# Patient Record
Sex: Male | Born: 1940 | Race: White | Hispanic: No | Marital: Married | Smoking: Former smoker
Health system: Southern US, Community
[De-identification: ages and names within clinical notes are randomized; demographics above are authoritative.]

## PROBLEM LIST (undated history)

## (undated) ENCOUNTER — Ambulatory Visit

## (undated) ENCOUNTER — Encounter: Attending: Geriatric Medicine | Primary: Geriatric Medicine

## (undated) ENCOUNTER — Encounter: Attending: Hematology & Oncology | Primary: Hematology & Oncology

## (undated) ENCOUNTER — Encounter

## (undated) ENCOUNTER — Ambulatory Visit: Payer: MEDICARE

## (undated) ENCOUNTER — Ambulatory Visit: Payer: MEDICARE | Attending: Hematology & Oncology | Primary: Hematology & Oncology

## (undated) ENCOUNTER — Institutional Professional Consult (permissible substitution): Payer: MEDICARE | Attending: Internal Medicine | Primary: Internal Medicine

## (undated) ENCOUNTER — Encounter: Attending: Internal Medicine | Primary: Internal Medicine

## (undated) ENCOUNTER — Telehealth: Attending: Radiation Oncology | Primary: Radiation Oncology

## (undated) ENCOUNTER — Telehealth

## (undated) ENCOUNTER — Ambulatory Visit: Attending: Radiation Oncology | Primary: Radiation Oncology

## (undated) ENCOUNTER — Telehealth: Attending: Internal Medicine | Primary: Internal Medicine

## (undated) ENCOUNTER — Telehealth: Attending: Geriatric Medicine | Primary: Geriatric Medicine

## (undated) ENCOUNTER — Ambulatory Visit: Payer: MEDICARE | Attending: Geriatric Medicine | Primary: Geriatric Medicine

## (undated) ENCOUNTER — Encounter
Attending: Student in an Organized Health Care Education/Training Program | Primary: Student in an Organized Health Care Education/Training Program

## (undated) ENCOUNTER — Telehealth: Attending: Hematology & Oncology | Primary: Hematology & Oncology

## (undated) ENCOUNTER — Encounter: Attending: Pharmacotherapy | Primary: Pharmacotherapy

## (undated) ENCOUNTER — Institutional Professional Consult (permissible substitution): Attending: Radiation Oncology | Primary: Radiation Oncology

## (undated) ENCOUNTER — Ambulatory Visit: Payer: MEDICARE | Attending: Internal Medicine | Primary: Internal Medicine

## (undated) ENCOUNTER — Telehealth
Attending: Pharmacist Clinician (PhC)/ Clinical Pharmacy Specialist | Primary: Pharmacist Clinician (PhC)/ Clinical Pharmacy Specialist

## (undated) ENCOUNTER — Encounter
Attending: Pharmacist Clinician (PhC)/ Clinical Pharmacy Specialist | Primary: Pharmacist Clinician (PhC)/ Clinical Pharmacy Specialist

## (undated) ENCOUNTER — Encounter: Attending: Radiation Oncology | Primary: Radiation Oncology

## (undated) ENCOUNTER — Telehealth: Attending: Children | Primary: Children

## (undated) DIAGNOSIS — L723 Sebaceous cyst: Secondary | ICD-10-CM

## (undated) DIAGNOSIS — L02239 Carbuncle of trunk, unspecified: Secondary | ICD-10-CM

## (undated) DIAGNOSIS — M419 Scoliosis, unspecified: Secondary | ICD-10-CM

## (undated) DIAGNOSIS — J449 Chronic obstructive pulmonary disease, unspecified: Secondary | ICD-10-CM

## (undated) DIAGNOSIS — M549 Dorsalgia, unspecified: Secondary | ICD-10-CM

## (undated) DIAGNOSIS — K219 Gastro-esophageal reflux disease without esophagitis: Secondary | ICD-10-CM

## (undated) DIAGNOSIS — S2231XA Fracture of one rib, right side, initial encounter for closed fracture: Secondary | ICD-10-CM

## (undated) DIAGNOSIS — C61 Malignant neoplasm of prostate: Secondary | ICD-10-CM

## (undated) DIAGNOSIS — L02229 Furuncle of trunk, unspecified: Secondary | ICD-10-CM

## (undated) HISTORY — DX: Scoliosis, unspecified: M41.9

## (undated) HISTORY — PX: EXCISIONAL HEMORRHOIDECTOMY: SHX1541

## (undated) HISTORY — DX: Furuncle of trunk, unspecified: L02.229

## (undated) HISTORY — DX: Chronic obstructive pulmonary disease, unspecified: J44.9

## (undated) HISTORY — PX: TONSILLECTOMY: SHX5217

## (undated) HISTORY — DX: Sebaceous cyst: L72.3

## (undated) HISTORY — DX: Dorsalgia, unspecified: M54.9

## (undated) HISTORY — DX: Carbuncle of trunk, unspecified: L02.239

## (undated) HISTORY — DX: Gastro-esophageal reflux disease without esophagitis: K21.9

## (undated) HISTORY — DX: Fracture of one rib, right side, initial encounter for closed fracture: S22.31XA

---
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
## 1997-07-10 ENCOUNTER — Emergency Department (HOSPITAL_COMMUNITY): Admission: RE | Admit: 1997-07-10 | Discharge: 1997-07-10 | Payer: Self-pay | Admitting: Orthopedic Surgery

## 2000-03-01 ENCOUNTER — Emergency Department (HOSPITAL_COMMUNITY): Admission: EM | Admit: 2000-03-01 | Discharge: 2000-03-01 | Payer: Self-pay | Admitting: Emergency Medicine

## 2000-03-01 ENCOUNTER — Encounter: Payer: Self-pay | Admitting: Emergency Medicine

## 2001-01-06 ENCOUNTER — Ambulatory Visit (HOSPITAL_COMMUNITY): Admission: RE | Admit: 2001-01-06 | Discharge: 2001-01-06 | Payer: Self-pay | Admitting: Internal Medicine

## 2001-01-06 ENCOUNTER — Encounter: Payer: Self-pay | Admitting: Internal Medicine

## 2004-09-10 ENCOUNTER — Ambulatory Visit (HOSPITAL_COMMUNITY): Admission: RE | Admit: 2004-09-10 | Discharge: 2004-09-10 | Payer: Self-pay | Admitting: Internal Medicine

## 2004-09-10 ENCOUNTER — Ambulatory Visit: Payer: Self-pay | Admitting: Internal Medicine

## 2005-08-07 ENCOUNTER — Ambulatory Visit: Payer: Self-pay | Admitting: Internal Medicine

## 2005-09-04 ENCOUNTER — Ambulatory Visit: Payer: Self-pay | Admitting: Internal Medicine

## 2005-09-07 ENCOUNTER — Emergency Department (HOSPITAL_COMMUNITY): Admission: EM | Admit: 2005-09-07 | Discharge: 2005-09-07 | Payer: Self-pay | Admitting: Family Medicine

## 2005-09-15 ENCOUNTER — Ambulatory Visit: Payer: Self-pay | Admitting: Internal Medicine

## 2005-09-22 ENCOUNTER — Ambulatory Visit: Payer: Self-pay | Admitting: Internal Medicine

## 2005-10-09 ENCOUNTER — Ambulatory Visit: Payer: Self-pay | Admitting: Internal Medicine

## 2005-10-27 ENCOUNTER — Ambulatory Visit: Payer: Self-pay | Admitting: Internal Medicine

## 2005-11-11 ENCOUNTER — Ambulatory Visit: Payer: Self-pay | Admitting: Emergency Medicine

## 2006-08-18 ENCOUNTER — Ambulatory Visit: Payer: Self-pay | Admitting: Internal Medicine

## 2006-12-26 ENCOUNTER — Encounter: Payer: Self-pay | Admitting: Internal Medicine

## 2006-12-26 ENCOUNTER — Ambulatory Visit: Payer: Self-pay | Admitting: Internal Medicine

## 2006-12-26 DIAGNOSIS — M4180 Other forms of scoliosis, site unspecified: Secondary | ICD-10-CM

## 2006-12-28 ENCOUNTER — Ambulatory Visit: Payer: Self-pay | Admitting: Internal Medicine

## 2006-12-28 LAB — CONVERTED CEMR LAB
Albumin: 4 g/dL (ref 3.5–5.2)
Basophils Absolute: 0 10*3/uL (ref 0.0–0.1)
Bilirubin Urine: NEGATIVE
CO2: 35 meq/L — ABNORMAL HIGH (ref 19–32)
Cholesterol: 198 mg/dL (ref 0–200)
Creatinine, Ser: 0.8 mg/dL (ref 0.4–1.5)
GFR calc Af Amer: 124 mL/min
HCT: 44.2 % (ref 39.0–52.0)
HDL: 45.2 mg/dL (ref >39.0)
Hemoglobin, Urine: NEGATIVE
Hemoglobin: 15.3 g/dL (ref 13.0–17.0)
Leukocytes, UA: NEGATIVE
Lymphocytes Relative: 26.8 % (ref 12.0–46.0)
MCHC: 34.6 g/dL (ref 30.0–36.0)
Monocytes Absolute: 0.5 10*3/uL (ref 0.2–0.7)
Neutro Abs: 3.1 10*3/uL (ref 1.4–7.7)
Neutrophils Relative %: 55.2 % (ref 43.0–77.0)
PSA: 0.8 ng/mL (ref 0.10–4.00)
Potassium: 4.4 meq/L (ref 3.5–5.1)
RDW: 12 % (ref 11.5–14.6)
Sodium: 139 meq/L (ref 135–145)
TSH: 1.68 microintl units/mL (ref 0.35–5.50)
Total Bilirubin: 1 mg/dL (ref 0.3–1.2)
Total Protein: 6.8 g/dL (ref 6.0–8.3)
Triglycerides: 102 mg/dL (ref 0–149)
Urobilinogen, UA: 0.2 (ref 0.0–1.0)
VLDL: 20 mg/dL (ref 0–40)
pH: 7 (ref 5.0–8.0)

## 2007-05-06 ENCOUNTER — Telehealth (INDEPENDENT_AMBULATORY_CARE_PROVIDER_SITE_OTHER): Payer: Self-pay | Admitting: *Deleted

## 2007-05-10 ENCOUNTER — Ambulatory Visit: Payer: Self-pay | Admitting: Internal Medicine

## 2007-05-10 DIAGNOSIS — L02229 Furuncle of trunk, unspecified: Secondary | ICD-10-CM

## 2007-05-11 ENCOUNTER — Ambulatory Visit: Payer: Self-pay | Admitting: Internal Medicine

## 2007-06-14 ENCOUNTER — Ambulatory Visit: Payer: Self-pay | Admitting: Internal Medicine

## 2007-08-24 ENCOUNTER — Telehealth: Payer: Self-pay | Admitting: Internal Medicine

## 2007-08-25 ENCOUNTER — Ambulatory Visit: Payer: Self-pay | Admitting: Internal Medicine

## 2007-08-25 ENCOUNTER — Encounter: Payer: Self-pay | Admitting: Internal Medicine

## 2007-08-25 ENCOUNTER — Encounter (INDEPENDENT_AMBULATORY_CARE_PROVIDER_SITE_OTHER): Payer: Self-pay | Admitting: *Deleted

## 2007-08-25 DIAGNOSIS — R0602 Shortness of breath: Secondary | ICD-10-CM

## 2007-09-22 ENCOUNTER — Ambulatory Visit: Payer: Self-pay | Admitting: Pulmonary Disease

## 2007-11-02 ENCOUNTER — Encounter: Payer: Self-pay | Admitting: Internal Medicine

## 2008-10-03 ENCOUNTER — Ambulatory Visit: Payer: Self-pay | Admitting: Internal Medicine

## 2008-10-03 DIAGNOSIS — M949 Disorder of cartilage, unspecified: Secondary | ICD-10-CM

## 2008-10-03 DIAGNOSIS — M899 Disorder of bone, unspecified: Secondary | ICD-10-CM | POA: Insufficient documentation

## 2008-11-20 ENCOUNTER — Ambulatory Visit: Payer: Self-pay | Admitting: Internal Medicine

## 2008-11-20 LAB — CONVERTED CEMR LAB
ALT: 14 units/L (ref 0–53)
AST: 20 units/L (ref 0–37)
Alkaline Phosphatase: 83 units/L (ref 39–117)
BUN: 12 mg/dL (ref 6–23)
Basophils Absolute: 0 10*3/uL (ref 0.0–0.1)
Bilirubin, Direct: 0.1 mg/dL (ref 0.0–0.3)
Calcium: 9.5 mg/dL (ref 8.4–10.5)
Creatinine, Ser: 0.8 mg/dL (ref 0.4–1.5)
GFR calc non Af Amer: 101.58 mL/min (ref >60)
Glucose, Bld: 87 mg/dL (ref 70–99)
HCT: 43 % (ref 39.0–52.0)
Lymphocytes Relative: 30.1 % (ref 12.0–46.0)
Lymphs Abs: 1.8 10*3/uL (ref 0.7–4.0)
Monocytes Relative: 10 % (ref 3.0–12.0)
Platelets: 239 10*3/uL (ref 150.0–400.0)
RDW: 11.8 % (ref 11.5–14.6)
Total Bilirubin: 0.8 mg/dL (ref 0.3–1.2)

## 2008-11-27 ENCOUNTER — Ambulatory Visit: Payer: Self-pay | Admitting: Internal Medicine

## 2008-11-30 ENCOUNTER — Encounter: Payer: Self-pay | Admitting: Internal Medicine

## 2008-12-01 ENCOUNTER — Ambulatory Visit: Payer: Self-pay | Admitting: Internal Medicine

## 2008-12-05 ENCOUNTER — Telehealth: Payer: Self-pay | Admitting: Internal Medicine

## 2009-10-16 ENCOUNTER — Ambulatory Visit: Payer: Self-pay | Admitting: Internal Medicine

## 2009-10-17 DIAGNOSIS — L723 Sebaceous cyst: Secondary | ICD-10-CM

## 2009-11-21 ENCOUNTER — Encounter: Payer: Self-pay | Admitting: Internal Medicine

## 2009-11-21 ENCOUNTER — Ambulatory Visit: Payer: Self-pay | Admitting: Internal Medicine

## 2009-11-21 LAB — CONVERTED CEMR LAB
Basophils Relative: 0.6 % (ref 0.0–3.0)
CO2: 33 meq/L — ABNORMAL HIGH (ref 19–32)
Calcium: 10 mg/dL (ref 8.4–10.5)
Eosinophils Absolute: 0.5 10*3/uL (ref 0.0–0.7)
Eosinophils Relative: 7.9 % — ABNORMAL HIGH (ref 0.0–5.0)
Hemoglobin: 15.4 g/dL (ref 13.0–17.0)
Lymphocytes Relative: 30.6 % (ref 12.0–46.0)
MCHC: 34.3 g/dL (ref 30.0–36.0)
Monocytes Relative: 10.4 % (ref 3.0–12.0)
Neutro Abs: 3 10*3/uL (ref 1.4–7.7)
RBC: 4.45 M/uL (ref 4.22–5.81)
Sodium: 137 meq/L (ref 135–145)

## 2010-01-09 ENCOUNTER — Encounter: Payer: Self-pay | Admitting: Internal Medicine

## 2010-01-30 ENCOUNTER — Encounter: Payer: Self-pay | Admitting: Internal Medicine

## 2010-02-06 ENCOUNTER — Encounter: Payer: Self-pay | Admitting: Internal Medicine

## 2010-04-04 NOTE — Assessment & Plan Note (Signed)
Summary: GROWTH ON SHOULDER/NWS  #   Vital Signs:  Patient profile:   70 year old male Height:      69 inches Weight:      154 pounds BMI:     22.82 O2 Sat:      96 % on Room air Temp:     98.4 degrees F oral Pulse rate:   66 / minute BP sitting:   110 / 70  (left arm) Cuff size:   regular  Vitals Entered By: Bill Salinas CMA (October 16, 2009 4:08 PM)  O2 Flow:  Room air CC: pt here for evaluation of 2 growths on his back/ ab   Primary Care Provider:  Brigido Mera  CC:  pt here for evaluation of 2 growths on his back/ ab.  History of Present Illness: patient presents for evaluation of a recurrent lesion on his back. In September he had excision of a sebaceous cyst. That note reveiwed: patient had thick dermis. No capsule was excised.   Current Medications (verified): 1)  Mens Multivitamin Plus   Tabs (Multiple Vitamins-Minerals) .... Once Daily 2)  Bl Vitamin C W/rose Hips 1000 Mg  Tabs (Ascorbic Acid) .... Once Daily 3)  Bl Vitamin E 400 Unit  Caps (Vitamin E) .... Once Daily 4)  Calcarb 600/d 600-400 Mg-Unit  Tabs (Calcium Carbonate-Vitamin D) .Marland Kitchen.. 1 Every Other Day 5)  Saw Palmetto 450 Mg  Caps (Saw Palmetto (Serenoa Repens)) .... 2 Tabs Two Times A Day 6)  Ibuprofen 800 Mg Tabs (Ibuprofen) .Marland Kitchen.. 1 Tab Three Times A Day As Needed  Allergies (verified): No Known Drug Allergies PMH-FH-SH reviewed-no changes except otherwise noted  Review of Systems       The patient complains of suspicious skin lesions.  The patient denies anorexia, fever, chest pain, and enlarged lymph nodes.    Physical Exam  General:  Well-developed,well-nourished,in no acute distress; alert,appropriate and cooperative throughout examination Skin:  at the scar line of previous excision is a small cyst.   Impression & Recommendations:  Problem # 1:  SEBACEOUS CYST (ICD-706.2) Most likely a sebaceous cyst-recurrent at site of previous cyst.  Plan - watchful waiting.   Complete Medication List: 1)   Mens Multivitamin Plus Tabs (Multiple vitamins-minerals) .... Once daily 2)  Bl Vitamin C W/rose Hips 1000 Mg Tabs (Ascorbic acid) .... Once daily 3)  Bl Vitamin E 400 Unit Caps (Vitamin e) .... Once daily 4)  Calcarb 600/d 600-400 Mg-unit Tabs (Calcium carbonate-vitamin d) .Marland Kitchen.. 1 every other day 5)  Saw Palmetto 450 Mg Caps (Saw palmetto (serenoa repens)) .... 2 tabs two times a day 6)  Ibuprofen 800 Mg Tabs (Ibuprofen) .Marland Kitchen.. 1 tab three times a day as needed

## 2010-04-04 NOTE — Letter (Signed)
[facility]: [facility]   Imported By: Lennie Odor 02/08/2010 11:19:49  _____________________________________________________________________  External Attachment:    Type:   Image     Comment:   External Document

## 2010-04-04 NOTE — Consult Note (Signed)
Summary: [facility]  [facility]   Imported By: Sherian Rein 02/14/2010 08:15:45  _____________________________________________________________________  External Attachment:    Type:   Image     Comment:   External Document

## 2010-04-04 NOTE — Assessment & Plan Note (Signed)
Summary: YEARLY FU / MEDICARE/NWS  #   Vital Signs:  Patient profile:   70 year old male Height:      69 inches Weight:      155 pounds BMI:     22.97 O2 Sat:      95 % on Room air Temp:     98.3 degrees F oral Pulse rate:   65 / minute BP sitting:   124 / 78  (left arm) Cuff size:   regular  Vitals Entered By: Bill Salinas CMA (November 21, 2009 10:11 AM)  O2 Flow:  Room air CC: cpx/ ab Comments Pt will get flu shot today/ab  Vision Screening:      Vision Comments: Normal eye exam Jan 2011 with slight change in glasses   Primary Care Provider:  Tamzin Bertling  CC:  cpx/ ab.  History of Present Illness: Patient presents for preventive care and general medical follow-up. He was last seen for a sebaceous cyst which is being watched. He has had no interval illness, surgery or injury. He is feeling well and plans to participate in the [place] [employer] [place].  He is fully independent in all ADLs. He remains very active in local affairs, reads voraciously, manages his own affairs, investments, books and balances his own check.  Preventive Screening-Counseling & Management  Alcohol-Tobacco     Alcohol drinks/day: <1     Alcohol type: wine,beer     >5/day in last 3 mos: yes     Smoking Status: quit     Year Quit: 1974  Caffeine-Diet-Exercise     Caffeine use/day: 2 cups daily     Does Patient Exercise: yes     Type of exercise: walking     Exercise (avg: min/session): 30-60     Times/week: 4  Hep-HIV-STD-Contraception     Dental Visit-last 6 months no     Sun Exposure-Excessive: no  Safety-Violence-Falls     Seat Belt Use: yes     Helmet Use: n/a     Firearms in the Home: no firearms in the home     Smoke Detectors: yes     Violence in the Home: no risk noted     Sexual Abuse: no     Fall Risk: Low fall risk      Sexual History:  currently monogamous.        Drug Use:  never.        Blood Transfusions:  no.    Current Medications (verified): 1)  Mens  Multivitamin Plus   Tabs (Multiple Vitamins-Minerals) .... Once Daily 2)  Bl Vitamin C W/rose Hips 1000 Mg  Tabs (Ascorbic Acid) .... Once Daily 3)  Bl Vitamin E 400 Unit  Caps (Vitamin E) .... Once Daily 4)  Calcarb 600/d 600-400 Mg-Unit  Tabs (Calcium Carbonate-Vitamin D) .Marland Kitchen.. 1 Every Other Day 5)  Saw Palmetto 450 Mg  Caps (Saw Palmetto (Serenoa Repens)) .... 2 Tabs Two Times A Day 6)  Ibuprofen 800 Mg Tabs (Ibuprofen) .Marland Kitchen.. 1 Tab Three Times A Day As Needed  Allergies (verified): No Known Drug Allergies  Past History:  Past Medical History: Last updated: 09/22/2007 UCD hemorrhoids/acute in mid 30's CARBUNCLE AND FURUNCLE OF TRUNK (ICD-680.2) BACKACHE NOS (ICD-724.5)    Past Surgical History: Last updated: 01/22/2007 Hemorrhoidectomy-in his mid 30's/ 50's Tonsillectomy-age 32-40  Family History: Last updated: 01/22/07 father-died 61 CVA, DM mother - 67, DM, Alzheimer's neg lung, colon, prostate cancer;  neg CAD maternal grandfather - TB paternal grandfather - cirrhosis  Social History: Last updated: 12/26/2006 married 1968 0 children retired Film/video editor; very active in politics SO in good health-well controlled blood pressure, obesity.  Risk Factors: Alcohol Use: <1 (11/21/2009) >5 drinks/d w/in last 3 months: yes (11/21/2009) Caffeine Use: 2 cups daily (11/21/2009) Exercise: yes (11/21/2009)  Risk Factors: Smoking Status: quit (11/21/2009) Passive Smoke Exposure: no (12/26/2006)  Social History: Caffeine use/day:  2 cups daily Dental Care w/in 6 mos.:  no Sun Exposure-Excessive:  no Seat Belt Use:  yes Fall Risk:  Low fall risk Sexual History:  currently monogamous Drug Use:  never Blood Transfusions:  no  Review of Systems  The patient denies anorexia, fever, weight loss, weight gain, decreased hearing, hoarseness, chest pain, dyspnea on exertion, peripheral edema, prolonged cough, headaches, abdominal pain, hematochezia, severe  indigestion/heartburn, incontinence, muscle weakness, suspicious skin lesions, difficulty walking, depression, unusual weight change, enlarged lymph nodes, and testicular masses.    Physical Exam  General:  Well-developed,well-nourished,in no acute distress; alert,appropriate and cooperative throughout examination Head:  Normocephalic and atraumatic without obvious abnormalities. No apparent alopecia or balding. Eyes:  No corneal or conjunctival inflammation noted. EOMI. Perrla. Funduscopic exam benign, without hemorrhages, exudates or papilledema. Vision grossly normal. Ears:  scant amount of cerumen right EAC. TMs normal. No skin changes in EACs Nose:  no external deformity and no external erythema.   Mouth:  upper partial denture. No oral lesions. Throat clear Neck:  supple, full ROM, no thyromegaly, and no carotid bruits.   Chest Wall:  mild scoliosis with prominence to the left back Lungs:  Normal respiratory effort, chest expands symmetrically. Lungs are clear to auscultation, no crackles or wheezes. Heart:  Normal rate and regular rhythm. S1 and S2 normal without gallop, murmur, click, rub or other extra sounds. Abdomen:  soft, non-tender, normal bowel sounds, and no hepatomegaly.   Rectal:  No external abnormalities noted. Normal sphincter tone. No rectal masses or tenderness. Prostate:  Prostate gland firm and smooth, no enlargement, nodularity, tenderness, mass, asymmetry or induration. Msk:  normal ROM, no joint tenderness, no joint swelling, no joint warmth, and no redness over joints.   Pulses:  2+ radial and dorsalis pedis pulses Extremities:  No clubbing, cyanosis, edema, or deformity noted with normal full range of motion of all joints.   Neurologic:  alert & oriented X3, cranial nerves II-XII intact, gait normal, and DTRs symmetrical and normal.   Skin:  turgor normal, color normal, no suspicious lesions, and no ulcerations.  Small, 2 cm, cystic structure at medial aspect of  left scapula Cervical Nodes:  no anterior cervical adenopathy and no posterior cervical adenopathy.   Inguinal Nodes:  no R inguinal adenopathy and no L inguinal adenopathy.   Psych:  Oriented X3, memory intact for recent and remote, normally interactive, good eye contact, and not anxious appearing.     Impression & Recommendations:  Problem # 1:  SEBACEOUS CYST (ICD-706.2) Refer to skin surgery center for excision. This is a recurrent lesion after previous excision.  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 He manages this with regular massage therapy and rare use of Ibuprofen.  His updated medication list for this problem includes:    Ibuprofen 800 Mg Tabs (Ibuprofen) .Marland Kitchen... 1 tab three times a day as needed  Problem # 3:  Preventive Health Care (ICD-V70.0) Patients medical history is unremarkable with no new medical conditions. PHysical  exam and EKG are normal. Lab. Immmunizations: tetnyus Sept '09, Pneumonia vaccine Sept '09, shingles vaccine and influenza vaccine today.  Patient has no signs of symptoms of depression. He is independent in ADLs. He is cognitive intact (see HPI). He is counselled to continue with back exercise and massage. He is referred to skin surgical center.  In summary - a very nice gentleman who is medically stable. He will return as needed or in 1 year.   Complete Medication List: 1)  Mens Multivitamin Plus Tabs (Multiple vitamins-minerals) .... Once daily 2)  Bl Vitamin C W/rose Hips 1000 Mg Tabs (Ascorbic acid) .... Once daily 3)  Bl Vitamin E 400 Unit Caps (Vitamin e) .... Once daily 4)  Calcarb 600/d 600-400 Mg-unit Tabs (Calcium carbonate-vitamin d) .Marland Kitchen.. 1 every other day 5)  Saw Palmetto 450 Mg Caps (Saw palmetto (serenoa repens)) .... 2 tabs two times a day 6)  Ibuprofen 800 Mg Tabs (Ibuprofen) .Marland Kitchen.. 1 tab three times a  day as needed  Other Orders: Zoster (Shingles) Vaccine Live [phone]) Admin 1st Vaccine (66440) Flu Vaccine 69yrs + MEDICARE PATIENTS (H4742) Administration Flu vaccine - MCR (G0008) TLB-BMP (Basic Metabolic Panel-BMET) (80048-METABOL) TLB-CBC Platelet - w/Differential (85025-CBCD) MC -Subsequent Annual Wellness Visit [phone])  Patient: [name] [name] Note: All result statuses are Final unless otherwise noted.  Tests: (1) BMP (METABOL)   Sodium                    137 mEq/L                   135-145   Potassium                 4.6 mEq/L                   3.5-5.1   Chloride                  98 mEq/L                    96-112   Carbon Dioxide       [H]  33 mEq/L                    19-32   Glucose                   81 mg/dL                    87-56   BUN                       11 mg/dL                    4-33   Creatinine                0.7 mg/dL                   2.9-5.1   Calcium                   10.0 mg/dL                  8.8-41.6   GFR                       122.18 mL/min               >  60  Tests: (2) CBC Platelet w/Diff (CBCD)   White Cell Count          6.0 K/uL                    4.5-10.5   Red Cell Count            4.45 Mil/uL                 4.22-5.81   Hemoglobin                15.4 g/dL                   62.9-52.8   Hematocrit                44.8 %                      39.0-52.0   MCV                  [H]  100.8 fl                    78.0-100.0   MCHC                      34.3 g/dL                   41.3-24.4   RDW                       13.1 %                      11.5-14.6   Platelet Count            249.0 K/uL                  150.0-400.0   Neutrophil %              50.5 %                      43.0-77.0   Lymphocyte %              30.6 %                      12.0-46.0   Monocyte %                10.4 %                      3.0-12.0   Eosinophils%         [H]  7.9 %                       0.0-5.0   Basophils %               0.6 %                       0.0-3.0   Neutrophill  Absolute      3.0 K/uL                    1.4-7.7   Lymphocyte Absolute       1.8 K/uL  0.7-4.0   Monocyte Absolute         0.6 K/uL                    0.1-1.0  Eosinophils, Absolute                             0.5 K/uL                    0.0-0.7   Basophils Absolute        0.0 K/uL                    0.0-0.1    Immunizations Administered:  Zostavax # 1:    Vaccine Type: Zostavax    Site: right arm    Mfr: Merck    Dose: 0.5 ml    Route: [place]    Given by: Ami Bullins CMA    Exp. Date: 09/26/2010    Lot #: [id number]    VIS given: 12/13/04 given November 21, 2009.    Flu Vaccine Consent Questions     Do you have a history of severe allergic reactions to this vaccine? no    Any prior history of allergic reactions to egg and/or gelatin? no    Do you have a sensitivity to the preservative Thimersol? no    Do you have a past history of Guillan-Barre Syndrome? no    Do you currently have an acute febrile illness? no    Have you ever had a severe reaction to latex? no    Vaccine information given and explained to patient? yes    Are you currently pregnant? no    Lot Number:[id number]   Exp Date:08/31/2010   Site Given  Left Deltoid IMflu

## 2010-04-04 NOTE — Consult Note (Signed)
[facility]: [facility]   Imported By: Sherian Rein 02/27/2010 09:44:46  _____________________________________________________________________  External Attachment:    Type:   Image     Comment:   External Document

## 2010-07-15 ENCOUNTER — Telehealth: Payer: Self-pay | Admitting: *Deleted

## 2010-07-15 MED ORDER — IBUPROFEN 800 MG PO TABS: 800.0000 mg | ORAL_TABLET | Freq: Three times a day (TID) | ORAL | Status: DC | PRN

## 2010-07-15 NOTE — Telephone Encounter (Signed)
Refill

## 2010-07-15 NOTE — Telephone Encounter (Signed)
OK to refill prn

## 2010-07-15 NOTE — Telephone Encounter (Signed)
Fax from [facility] on [address]  For Motrin 800mg  SIG take one tablet three times a day prn. QTY 93 last fill date was 12/30/2007. Please Advise refill

## 2010-07-19 NOTE — Assessment & Plan Note (Signed)
[facility]                               PULMONARY OFFICE NOTE   NAME:[name] [name] VISCONTI                      MRN:          [id number]  DATE:11/11/2005                            DOB:          25-Apr-1940    SLEEP CONSULTATION NOTE:   REASON FOR CONSULTATION:  [name] [name] is kindly referred by Dr. Illene Regulus for nighttime wheezing and occasional dyspnea while in bed.   SUBJECTIVE:  [name] [name] is a 70 year old gentleman with a history of GERD  and low back pain.  He tells me that he first began to notice some noise  while he was lying supine about two years ago.  The noise happened about 3-4  nights a month initially,  but it has become more frequent over time.  His  wife is able to hear the noise, and he says it sounds like a whistling or a  wood wind instrument.  He also occasionally wakes up with dyspnea.  Usually  when this happens, he also hears the same wheezing noise.  The noise is  heard during inspiration but not expiration.  It always seems to happen when  he is awake.  He has never been told by his spouse that he makes this noise  while he is asleep.  He has occasionally heard this noise while breathing  while he is awake during the day.  He cannot identify any particular  triggers to when this occurs.  He tells me that he has occasional cough but  that it is not usually associated with these episodes.  The cough is  nonproductive.  He has frequent bouts of sinusitis and occasionally he will  have sneezing fits.  These can be associated with activity or energy  expenditure.  He has remained quite active.  He used to jog, but he stopped  due to knee and hip pain.  He currently walks, and he is able to exert  himself without shortness of breath.   With regard to sleep hygiene, [name] [name] goes to bed between 11:00 p.m. and  1:00 a.m.  He falls asleep in five minutes and usually wakes up about once a  night.  He gets out of bed at 7:00  a.m.  He has not had any significant  weight change, and he has never had a sleep study.  He is sometimes sleepy  after lunch, but he does not take naps.  He generally feels well rested in  the mornings and does not have morning headaches.  An Epworth Sleepiness  Scale score was 5/24.   Review of systems is otherwise significant for gastroesophageal reflux  disease.  He has treated this mainly with lifestyle modification.  He is  usually able to avoid heartburn symptoms by having his bed up on an incline  by modifying his diet.  He does have some reflux when he is lying down flat.  He formerly had some teeth grinding at night but does not do this currently.  His review of systems otherwise per the HPI.   PAST MEDICAL HISTORY:  1. GERD.  2. Low back pain.  3. Hemorrhoidectomy 15 years ago.  4. Tonsillectomy 30 years ago.   ALLERGIES:  No known drug allergies.   CURRENT MEDICATIONS:  1. Ibuprofen 800 mg t.i.d.  2. Multivitamin daily.  3. Vitamin C daily.  4. Calcium plus vitamin D 600 mg daily.  5. Proscar 5 mg daily.  6. Vitamin E daily.   SOCIAL HISTORY:  He is originally from [place] and immigrated to the [place] over 40 years ago.  He denies any significant occupational exposures.  He is a former smoker with approximately 45-55 pack year total history.  He  quit in 1973.  He drinks alcohol socially.   FAMILY HISTORY:  Significant for COPD in his mother.   PHYSICAL EXAMINATION:  VITAL SIGNS:  Weight 153 pounds.  Temperature 98.4,  blood pressure 118/74, heart rate 70, SpO2 94% on room air.  GENERAL:  This is a pleasant, well-appearing gentleman who is in no distress  on room air.  HEENT:  Oropharynx is clear.  He has no stridor.  NECK:  Supple without lymphadenopathy.  LUNGS:  Clear to auscultation bilaterally.  HEART:  Regular rate and rhythm without murmur, rub or gallop.  ABDOMEN:  Soft, nontender, nondistended with positive bowel sounds.  EXTREMITIES:  No  clubbing, cyanosis or edema.  NEUROLOGIC:  He has a grossly nonfocal exam.   Pulmonary function testing was performed on October 09, 2005.  This showed an  FVC of 4.79 liters, or 119% of predicted, an FEV1 of 3.66 liters, or 131% of  predicted, and a normal ratio of 76%.  There was no bronchodilator response.  His total lung capacity was slightly elevated at 124% of predicted,  consistent with some mild hyperinflation.  His DLCO was normal.   IMPRESSION:  1. Nocturnal wheezing that is likely upper airway in nature.  I question      whether there may be some impact of untreated gastroesophageal reflux      disease that may be contributing to his symptoms.  He denies any      daytime sleepiness, and he has not had any snoring or witnessed apneas.      I am less suspicious that he has sleep disorder breathing.  2. Tobacco use (remote) with almost normal pulmonary function testing with      the exception of some mild hyperinflation.  I do not feel compelled to      start him on a bronchodilator regimen at this time.  If he does have      any chronic obstructive pulmonary disease, then certainly it is mild,      and I am not sure this correlates with his nighttime symptoms.   PLAN:  1. Trial of a proton pump inhibitor to treat and also assess the      contribution of GERD to his upper airway symptoms.  2. Continue his other lifestyle modifications, including his diet and      keeping his head of bed elevated.  3. I will not order a polysomnogram at this time.  4. I will follow up with [name] [name] in three months to assess his      improvement.  If he should have any difficulty, he will contact me, and      we will see each other sooner.  Leslye Peer, MD   RSB/MedQ  DD:  11/12/2005  DT:  11/13/2005  Job #:  [id number]   cc:   [name] Gess. Norins, MD

## 2010-07-19 NOTE — Letter (Signed)
October 27, 2005     Leslye Peer, MD  [address]  [place], [place] [address]   RE:  CALUB, TARNOW  MRN:  [id number]  /  DOB:  [birth year]   Dear Dr. Delton Coombes:   Thank you very much for seeing and evaluating Mr. Vigen for nocturnal  dyspnea. Mr. Heber is a very pleasant 70 year old gentleman who is  followed for IBS and GERD and generally in good health. He has been having  difficulty with increasing wheezing at night when lying down. His evaluation  to date has included a chest x-ray performed August 07, 2005 which was read out  as showing emphysema. He was noted to have a left perihilar nodular density  7 to 8 mm that is being followed up today. The radiologist thought this was  a vessel on end. The patient did have full PFTs with bronchodilators  performed October 09, 2005 which was read by Dr. Fannie Knee as a normal study  with normal diffusion capacity. At this point the patient appears to have  nocturnal dyspnea and nocturnal wheezing.   The patient's past medical history is well-documented in my recent chart  note of August 06, 2005.  I appreciate your assistance in evaluating this nice gentleman and look  forward to hearing from you.   Sincerely,     Rosalyn Gess. Norins, MD   MEN/MedQ  DD:  10/27/2005  DT:  10/27/2005  Job #:  [id number]

## 2011-01-09 ENCOUNTER — Encounter: Payer: Self-pay | Admitting: Internal Medicine

## 2011-01-13 ENCOUNTER — Ambulatory Visit (INDEPENDENT_AMBULATORY_CARE_PROVIDER_SITE_OTHER): Payer: Medicare Other | Admitting: Internal Medicine

## 2011-01-13 ENCOUNTER — Other Ambulatory Visit (INDEPENDENT_AMBULATORY_CARE_PROVIDER_SITE_OTHER): Payer: Medicare Other

## 2011-01-13 VITALS — BP 152/86 | HR 84 | Temp 98.2°F | Wt 156.0 lb

## 2011-01-13 DIAGNOSIS — Z79899 Other long term (current) drug therapy: Secondary | ICD-10-CM

## 2011-01-13 DIAGNOSIS — Z0000 Encounter for general adult medical examination without abnormal findings: Secondary | ICD-10-CM

## 2011-01-13 NOTE — Progress Notes (Signed)
Subjective:    Patient ID: [name] [name], male    DOB: 1941/01/07, 70 y.o.   MRN: [id number]  HPI   The patient is here for annual Medicare wellness examination and management of other chronic and acute problems. He has been healthy: no major illness, no surgery and no major injury.    The risk factors are reflected in the social history.  The roster of all physicians providing medical care to patient - is listed in the Snapshot section of the chart.  Activities of daily living:  The patient is 100% inedpendent in all ADLs: dressing, toileting, feeding as well as independent mobility  Home safety : The patient has smoke detectors in the home. They wear seatbelts. No firearms at home. There is no violence in the home.   There is no risks for hepatitis, STDs or HIV. There is no   history of blood transfusion. They have no travel history to infectious disease endemic areas of the world.  The patient has seen their dentist in the last six month. They have seen their eye doctor in the last year. They deny  any hearing difficulty and have not had audiologic testing in the last year.  They do not  have excessive sun exposure. Discussed the need for sun protection: hats, long sleeves and use of sunscreen if there is significant sun exposure.   Diet: the importance of a healthy diet is discussed. They do have a healthy diet.  The patient has a regular exercise program: walking  , 4 mile duration, 7 days per week.  The benefits of regular aerobic exercise were discussed.  Depression screen: there are no signs or vegative symptoms of depression- irritability, change in appetite, anhedonia, sadness/tearfullness.  Cognitive assessment: the patient manages all their financial and personal affairs and is actively engaged.   The following portions of the patient's history were reviewed and updated as appropriate: allergies, current medications, past family history, past medical history,  past surgical  history, past social history  and problem list.  Vision, hearing, body mass index were assessed and reviewed.   During the course of the visit the patient was educated and counseled about appropriate screening and preventive services including : fall prevention , diabetes screening, nutrition counseling, colorectal cancer screening, and recommended immunizations.  Past Medical History  Diagnosis Date  . BACKACHE NOS 12/26/2006  . Carbuncle and furuncle of trunk 05/10/2007  . Sebaceous cyst 10/17/2009  . SOB 08/25/2007   Past Surgical History  Procedure Date  . Tonsillectomy   . Excisional hemorrhoidectomy    Family History  Problem Relation Age of Onset  . Alzheimer's disease Mother   . Diabetes Mother   . Diabetes Father   . Cirrhosis Paternal Grandfather    History   Social History  . Marital Status: Married    Spouse Name: N/A    Number of Children: N/A  . Years of Education: N/A   Occupational History  . Not on file.   Social History Main Topics  . Smoking status: Never Smoker   . Smokeless tobacco: Not on file  . Alcohol Use: No  . Drug Use: No  . Sexually Active:    Other Topics Concern  . 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  HEENT:  Negative for hearing loss, ear pain, congestion, neck stiffness and postnasal drip. Negative for sore throat or swallowing problems. Negative for dental complaints.  Has hade an extraction. Has a new appliance which is not fit well. He had edema of the right lower lip - unexplained after a visit to Urgent Care - got better with two days of B12.  Eyes: Negative for vision loss or change in visual acuity.  Respiratory: Negative for chest tightness and wheezing. Negative for DOE.  His breathing is more  audible at night when supine. Cardiovascular: Negative for chest pain or palpitations. No decreased exercise tolerance Gastrointestinal: No change in bowel habit. No bloating or gas. No reflux or indigestion Genitourinary: Negative for urgency, frequency, flank pain and difficulty urinating.  Musculoskeletal: Negative for myalgias, back pain, arthralgias and gait problem. Usual post-exercise stiffness. He is getting a rolfing treatment q 2 wks.  Neurological: Negative for dizziness, tremors, weakness and headaches.  Hematological: Negative for adenopathy.  Psychiatric/Behavioral: Negative for behavioral problems and dysphoric mood.       Objective:   Physical Exam Vital signs reviewed Gen'l: Well nourished well developed white male in no acute distress  HEENT: Head: Normocephalic and atraumatic. Right Ear: External ear normal. EAC/TM nl. Left Ear: External ear normal.  EAC/TM nl. Nose: Nose normal. Mouth/Throat: Oropharynx is clear and moist. Dentition - native, in good repair. No buccal or palatal lesions. Posterior pharynx clear. Eyes: Conjunctivae and sclera clear. EOM intact. Pupils are equal, round, and reactive to light. Right eye exhibits no discharge. Left eye exhibits no discharge. Neck: Normal range of motion. Neck supple. No JVD present. No tracheal deviation present. No thyromegaly present.  Cardiovascular: Normal rate, regular rhythm, no gallop, no friction rub, no murmur heard.      Quiet precordium. 2+ radial and DP pulses . No carotid bruits Pulmonary/Chest: Effort normal. No respiratory distress or increased WOB, no wheezes, no rales. No chest wall deformity or CVAT. Abdominal: Soft. Bowel sounds are normal in all quadrants. He exhibits no distension, no tenderness, no rebound or guarding, No heptosplenomegaly  Genitourinary:  deferred Musculoskeletal: Normal range of motion. He exhibits no edema and no tenderness.       Small and large joints without redness, synovial  thickening or deformity. Full range of motion preserved about all small, median and large joints.  Lymphadenopathy:    He has no cervical or supraclavicular adenopathy.  Neurological: He is alert and oriented to person, place, and time. CN II-XII intact. DTRs 2+ and symmetrical biceps, radial and patellar tendons. Cerebellar function normal with no tremor, rigidity, normal gait and station.  Skin: Skin is warm and dry. No rash noted. No erythema.  Psychiatric: He has a normal mood and affect. His behavior is normal. Thought content normal.   Lab Results  Component Value Date   WBC 5.8 01/13/2011   HGB 14.4 01/13/2011   HCT 42.3 01/13/2011   PLT 247.0 01/13/2011   GLUCOSE 101* 01/13/2011   CHOL 177 01/13/2011   TRIG 93.0 01/13/2011   HDL 48.80 01/13/2011   LDLCALC 110* 01/13/2011   ALT 17 01/13/2011   AST 24 01/13/2011   NA 135 01/13/2011   K 3.8 01/13/2011   CL 98 01/13/2011   CREATININE 0.9 01/13/2011   BUN 14 01/13/2011   CO2 30 01/13/2011   TSH 1.68 12/28/2006   PSA 0.80 12/28/2006            Assessment & Plan:

## 2011-01-14 DIAGNOSIS — Z0000 Encounter for general adult medical examination without abnormal findings: Secondary | ICD-10-CM | POA: Insufficient documentation

## 2011-01-14 LAB — BASIC METABOLIC PANEL
CO2: 30 mEq/L (ref 19–32)
Calcium: 9.4 mg/dL (ref 8.4–10.5)
Creatinine, Ser: 0.9 mg/dL (ref 0.4–1.5)
Glucose, Bld: 101 mg/dL — ABNORMAL HIGH (ref 70–99)
Sodium: 135 mEq/L (ref 135–145)

## 2011-01-14 LAB — CBC WITH DIFFERENTIAL/PLATELET
Basophils Absolute: 0 10*3/uL (ref 0.0–0.1)
Eosinophils Relative: 3.4 % (ref 0.0–5.0)
Lymphocytes Relative: 28.9 % (ref 12.0–46.0)
Lymphs Abs: 1.7 10*3/uL (ref 0.7–4.0)
Monocytes Relative: 9.2 % (ref 3.0–12.0)
Neutrophils Relative %: 58.2 % (ref 43.0–77.0)
Platelets: 247 10*3/uL (ref 150.0–400.0)
RDW: 12.8 % (ref 11.5–14.6)
WBC: 5.8 10*3/uL (ref 4.5–10.5)

## 2011-01-14 LAB — LIPID PANEL
Cholesterol: 177 mg/dL (ref 0–200)
HDL: 48.8 mg/dL (ref >39.00)
LDL Cholesterol: 110 mg/dL — ABNORMAL HIGH (ref 0–99)
Total CHOL/HDL Ratio: 4
Triglycerides: 93 mg/dL (ref 0.0–149.0)
VLDL: 18.6 mg/dL (ref 0.0–40.0)

## 2011-01-14 LAB — HEPATIC FUNCTION PANEL
Alkaline Phosphatase: 78 U/L (ref 39–117)
Bilirubin, Direct: 0 mg/dL (ref 0.0–0.3)
Total Bilirubin: 0.7 mg/dL (ref 0.3–1.2)

## 2011-01-14 NOTE — Assessment & Plan Note (Signed)
Interval medical history is benign. Physical exam is normal. Lab results are in normal limits. He is current with colorectal cancer screening with last exam in '07. He is up to date with all immunizations.   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 He takes very good care of himself and is encouraged to continue the same, especially resuming his exercise program.  He will return as needed or in 1-2 years.

## 2011-01-16 ENCOUNTER — Encounter: Payer: Self-pay | Admitting: Internal Medicine

## 2011-01-17 ENCOUNTER — Ambulatory Visit (INDEPENDENT_AMBULATORY_CARE_PROVIDER_SITE_OTHER)
Admission: RE | Admit: 2011-01-17 | Discharge: 2011-01-17 | Disposition: A | Discharge status: Home / Self Care | Payer: Medicare Other | Source: Ambulatory Visit | Attending: Internal Medicine | Admitting: Internal Medicine

## 2011-01-17 ENCOUNTER — Ambulatory Visit (INDEPENDENT_AMBULATORY_CARE_PROVIDER_SITE_OTHER): Payer: Medicare Other | Admitting: Internal Medicine

## 2011-01-17 ENCOUNTER — Encounter: Payer: Self-pay | Admitting: Internal Medicine

## 2011-01-17 VITALS — BP 122/80 | HR 72 | Temp 98.9°F | Wt 157.4 lb

## 2011-01-17 DIAGNOSIS — M79609 Pain in unspecified limb: Secondary | ICD-10-CM

## 2011-01-17 DIAGNOSIS — M79652 Pain in left thigh: Secondary | ICD-10-CM

## 2011-01-17 DIAGNOSIS — M25552 Pain in left hip: Secondary | ICD-10-CM

## 2011-01-17 DIAGNOSIS — M25559 Pain in unspecified hip: Secondary | ICD-10-CM

## 2011-01-17 MED ORDER — HYDROCODONE-ACETAMINOPHEN 5-500 MG PO TABS: 1.0000 | ORAL_TABLET | ORAL | Status: AC | PRN

## 2011-01-17 NOTE — Patient Instructions (Signed)
It was good to see you today. Hip x-ray ordered today. Your results will be called to you after review (48-72hours after test completion). If any changes need to be made, you will be notified at that time. Continue ibuprofen 3 times a day with food as previously prescribed Also okay to use low-dose Vicodin 1/2-1 tablet as needed for control pain symptoms - Your prescription(s) have been submitted to your pharmacy. Please take as directed and contact our office if you believe you are having problem(s) with the medication(s). If continued pain symptoms after 2 weeks or if symptoms worse, please call for reevaluation as needed

## 2011-01-17 NOTE — Progress Notes (Signed)
  Subjective:    Patient ID: [name] [name], male    DOB: Aug 24, 1940, 70 y.o.   MRN: [id number]  HPI  complains of left thigh pain Onset 4 days ago Pain symptoms worse with flexion of hip and standing from sitting position Not improved with ibuprofen in past 4 days +accidental fall 4 weeks ago - landed on R knee No swelling of thigh - No calf swelling Pain anterior thigh>inner thigh and groin>external hip rotators No increase or change in chronic back pain symptoms   Past Medical History  Diagnosis Date  . BACKACHE NOS   . Carbuncle and furuncle of trunk   . Sebaceous cyst      Review of Systems Cardiovascular : No chest pain or syncope Respiratory :No shortness of breath or cough    Objective:   Physical Exam  BP 122/80  Pulse 72  Temp(Src) 98.9 F (37.2 C) (Oral)  Wt 157 lb 6.4 oz (71.396 kg)  SpO2 98% Constitutional:  He appears well-developed and well-nourished. No distress.  Neck: Normal range of motion. Neck supple. No JVD present. No thyromegaly present.  Cardiovascular: Normal rate, regular rhythm and normal heart sounds.  No murmur heard. no BLE edema Pulmonary/Chest: Effort normal and breath sounds normal. No respiratory distress. no wheezes.  Musculoskeletal: Left hip with painful flexion motion and external rotation. Minimally painful over palpation of groin, nontender palpation of thigh/femur. No gross deformity. Neurovascular sensation distally intact  Neurological: he is alert and oriented to person, place, and time. No cranial nerve deficit. Coordination normal.  gait cautious regarding left hip - avoids weightbearing Skin: Skin is warm and dry.  No erythema, bruising, rash or ulceration.  Psychiatric: he has a normal mood and affect. behavior is normal. Judgment and thought content normal.         Assessment & Plan:  Left thigh pain, concern for hip source - question stress fracture or flare of DJD- check plain film now Continue prescription  ibuprofen as previously prescribed encouraged 3 times a day dosing with food Also limited number of low-dose Vicodin to use sparingly for unrelieved pain Patient to followup here for further evaluation if symptoms worse or unimproved with current treatment  Addendum: Hip x-ray report reviewed> no evidence of fracture. Patient notified of same. Will call if symptoms worse or unimproved

## 2011-01-18 ENCOUNTER — Encounter: Payer: Self-pay | Admitting: Internal Medicine

## 2011-05-01 ENCOUNTER — Other Ambulatory Visit: Payer: Self-pay | Admitting: Internal Medicine

## 2011-05-02 DIAGNOSIS — S2231XA Fracture of one rib, right side, initial encounter for closed fracture: Secondary | ICD-10-CM | POA: Insufficient documentation

## 2011-05-05 ENCOUNTER — Other Ambulatory Visit: Payer: Self-pay | Admitting: Internal Medicine

## 2011-12-03 ENCOUNTER — Encounter: Payer: Self-pay | Admitting: Internal Medicine

## 2011-12-03 ENCOUNTER — Ambulatory Visit (INDEPENDENT_AMBULATORY_CARE_PROVIDER_SITE_OTHER): Payer: Medicare Other | Admitting: Internal Medicine

## 2011-12-03 ENCOUNTER — Other Ambulatory Visit (INDEPENDENT_AMBULATORY_CARE_PROVIDER_SITE_OTHER): Payer: Medicare Other

## 2011-12-03 VITALS — BP 124/80 | HR 75 | Temp 98.6°F | Resp 16 | Wt 150.0 lb

## 2011-12-03 DIAGNOSIS — M549 Dorsalgia, unspecified: Secondary | ICD-10-CM

## 2011-12-03 DIAGNOSIS — Z23 Encounter for immunization: Secondary | ICD-10-CM | POA: Insufficient documentation

## 2011-12-03 DIAGNOSIS — Z0000 Encounter for general adult medical examination without abnormal findings: Secondary | ICD-10-CM

## 2011-12-03 LAB — COMPREHENSIVE METABOLIC PANEL
ALT: 17 U/L (ref 0–53)
AST: 23 U/L (ref 0–37)
Alkaline Phosphatase: 74 U/L (ref 39–117)
Sodium: 136 mEq/L (ref 135–145)
Total Bilirubin: 0.7 mg/dL (ref 0.3–1.2)
Total Protein: 6.6 g/dL (ref 6.0–8.3)

## 2011-12-03 NOTE — Patient Instructions (Addendum)
Normal exam. YOu will receive notification by mail or e-mail of your labs.

## 2011-12-03 NOTE — Assessment & Plan Note (Signed)
Interval history - benign. Physical exam - normal. Current with colorectal cancer screening. Aged out of prostate cancer screening. Immunizations are up to date.   IN summary - a delightful gentleman who is medically stabe. He is advised to check CDC.Gov/traveler in regard to immunizations he may need for travel to [place]. He will return as needed or in 1 year.

## 2011-12-03 NOTE — Progress Notes (Signed)
Subjective:    Patient ID: [name] [name], male    DOB: [birth year], 71 y.o.   MRN: [id number]  HPI The patient is here for annual Medicare wellness examination and management of other chronic and acute problems. He has been feeling well and continues to be very active both physically and mentally.    The risk factors are reflected in the social history.  The roster of all physicians providing medical care to patient - is listed in the Snapshot section of the chart.  Activities of daily living:  The patient is 100% inedpendent in all ADLs: dressing, toileting, feeding as well as independent mobility  Home safety : The patient has smoke detectors in the home. Fall - recommend home survey and modifications.  They wear seatbelts. No firearms at home. There is no violence in the home.   There is no risks for hepatitis, STDs or HIV. There is no   history of blood transfusion. They have no travel history to infectious disease endemic areas of the world.  The patient has seen their dentist in the last 12 months. They have seen their eye doctor in the last year. They deny any hearing difficulty and have had audiologic testing in the last year.   They do not  have excessive sun exposure. Discussed the need for sun protection: hats, long sleeves and use of sunscreen if there is significant sun exposure.   Diet: the importance of a healthy diet is discussed. They do have a healthy diet.  The patient has a regular exercise program: walks, weight training ,  90 min duration, 3 per week.  The benefits of regular aerobic exercise were discussed.  Depression screen: there are no signs or vegative symptoms of depression- irritability, change in appetite, anhedonia, sadness/tearfullness.  Cognitive assessment: the patient manages all their financial and personal affairs and is actively engaged.   Vision, hearing, body mass index were assessed and reviewed.   During the course of the visit the patient was  educated and counseled about appropriate screening and preventive services including : fall prevention , diabetes screening, nutrition counseling, colorectal cancer screening, and recommended immunizations.  Past Medical History  Diagnosis Date  . BACKACHE NOS   . Carbuncle and furuncle of trunk   . Sebaceous cyst    Past Surgical History  Procedure Date  . Tonsillectomy   . Excisional hemorrhoidectomy    Family History  Problem Relation Age of Onset  . Alzheimer's disease Mother   . Diabetes Mother   . Diabetes Father   . Cirrhosis Paternal Grandfather    History   Social History  . Marital Status: Married    Spouse Name: N/A    Number of Children: N/A  . Years of Education: N/A   Occupational History  . Not on file.   Social History Main Topics  . Smoking status: Former Games developer  . Smokeless tobacco: Not on file  . Alcohol Use: No  . Drug Use: No  . Sexually Active: Not on file   Other Topics Concern  . 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  HEENT:  Negative for hearing loss, ear pain, congestion, neck stiffness and postnasal drip. Negative for sore throat or swallowing problems. Negative for dental complaints.   Eyes:  Negative for vision loss or change in visual acuity.  Respiratory: Negative for chest tightness and wheezing. Negative for DOE.   Cardiovascular: Negative for chest pain or palpitations. No decreased exercise tolerance Gastrointestinal: No change in bowel habit. No bloating or gas. No reflux or indigestion Genitourinary: Negative for urgency, frequency, flank pain and difficulty urinating.  Musculoskeletal: Negative for myalgias, back pain, arthralgias and gait problem.  Neurological: Negative for dizziness, tremors,  weakness and headaches.  Hematological: Negative for adenopathy.  Psychiatric/Behavioral: Negative for behavioral problems and dysphoric mood.       Objective:   Physical Exam Filed Vitals:   12/03/11 1019  BP: 124/80  Pulse: 75  Temp: 98.6 F (37 C)  Resp: 16   Wt Readings from Last 3 Encounters:  12/03/11 150 lb (68.04 kg)  01/17/11 157 lb 6.4 oz (71.396 kg)  01/13/11 156 lb (70.761 kg)   Gen'l: Well nourished well developed white  male in no acute distress  HEENT: Head: Normocephalic and atraumatic. Right Ear: External ear normal. EAC/TM nl. Left Ear: External ear normal.  EAC/TM nl. Nose: Nose normal. Mouth/Throat: Oropharynx is clear and moist. Dentition - native, in good repair. No buccal or palatal lesions. Posterior pharynx clear. Eyes: Conjunctivae and sclera clear. EOM intact. Pupils are equal, round, and reactive to light. Right eye exhibits no discharge. Left eye exhibits no discharge. Neck: Normal range of motion. Neck supple. No JVD present. No tracheal deviation present. No thyromegaly present.  Cardiovascular: Normal rate, regular rhythm, no gallop, no friction rub, no murmur heard.      Quiet precordium. 2+ radial and DP pulses . No carotid bruits Pulmonary/Chest: Effort normal. No respiratory distress or increased WOB, no wheezes, no rales. No chest wall deformity or CVAT. Abdomen: Soft. Bowel sounds are normal in all quadrants. He exhibits no distension, no tenderness, no rebound or guarding, No heptosplenomegaly  Genitourinary:  deferred Musculoskeletal: Normal range of motion. He exhibits no edema and no tenderness.       Small and large joints without redness, synovial thickening or deformity. Full range of motion preserved about all small, median and large joints.  Lymphadenopathy:    He has no cervical or supraclavicular adenopathy.  Neurological: He is alert and oriented to person, place, and time. CN II-XII intact. DTRs 2+ and symmetrical biceps, radial and  patellar tendons. Cerebellar function normal with no tremor, rigidity, normal gait and station.  Skin: Skin is warm and dry. No rash noted. No erythema.  Psychiatric: He has a normal mood and affect. His behavior is normal. Thought content normal.   Lab Results  Component Value Date   WBC 5.8 01/13/2011   HGB 14.4 01/13/2011   HCT 42.3 01/13/2011   PLT 247.0 01/13/2011   GLUCOSE 95 12/03/2011   CHOL 177 01/13/2011   TRIG 93.0 01/13/2011   HDL 48.80 01/13/2011   LDLCALC 110* 01/13/2011   ALT 17 12/03/2011   AST 23 12/03/2011   NA 136 12/03/2011   K 4.4 12/03/2011   CL 99 12/03/2011   CREATININE 0.7 12/03/2011   BUN 15 12/03/2011   CO2 31 12/03/2011   TSH 1.68 12/28/2006   PSA 0.80 12/28/2006           Assessment & Plan:

## 2011-12-03 NOTE — Assessment & Plan Note (Signed)
Chronic back pain that does not limit activity  Plan - referred to [email or web address] for videos on back exercise.

## 2011-12-17 ENCOUNTER — Telehealth: Payer: Self-pay | Admitting: Internal Medicine

## 2011-12-17 NOTE — Telephone Encounter (Signed)
Caller: [facility]/Patient; Patient Name: [name] [name], [name] [name]; PCP: Illene Regulus (Adults only); Best Callback Phone Number: [phone].  Call regarding Cough and Runny Nose, Sneezing, onset 1 week.  Afebrile. Pt feels SOB while laying down.  Pt has taken anti-histamine for 1 week, no relief. All emergent symptoms ruled out per Allergies protocol, see in 72 hours due to symptoms have not responded to several days of home care.  Discussed Benadryl, Sudafed and fluids per care advice.  Advised Pt to call back if symptoms didn't improve within 3 days.  Pt verbalized understanding.

## 2012-05-01 DIAGNOSIS — S2231XA Fracture of one rib, right side, initial encounter for closed fracture: Secondary | ICD-10-CM

## 2012-05-01 HISTORY — DX: Fracture of one rib, right side, initial encounter for closed fracture: S22.31XA

## 2012-05-31 ENCOUNTER — Ambulatory Visit (INDEPENDENT_AMBULATORY_CARE_PROVIDER_SITE_OTHER): Payer: Medicare Other | Admitting: Internal Medicine

## 2012-05-31 ENCOUNTER — Ambulatory Visit (INDEPENDENT_AMBULATORY_CARE_PROVIDER_SITE_OTHER)
Admission: RE | Admit: 2012-05-31 | Discharge: 2012-05-31 | Disposition: A | Discharge status: Home / Self Care | Payer: Medicare Other | Source: Ambulatory Visit | Attending: Internal Medicine | Admitting: Internal Medicine

## 2012-05-31 ENCOUNTER — Encounter: Payer: Self-pay | Admitting: Internal Medicine

## 2012-05-31 VITALS — BP 120/72 | HR 69 | Temp 98.3°F | Wt 152.8 lb

## 2012-05-31 DIAGNOSIS — R079 Chest pain, unspecified: Secondary | ICD-10-CM

## 2012-05-31 DIAGNOSIS — R0781 Pleurodynia: Secondary | ICD-10-CM

## 2012-05-31 DIAGNOSIS — R091 Pleurisy: Secondary | ICD-10-CM

## 2012-05-31 MED ORDER — IBUPROFEN 800 MG PO TABS: 800.0000 mg | ORAL_TABLET | Freq: Three times a day (TID) | ORAL | Status: DC | PRN

## 2012-05-31 NOTE — Patient Instructions (Signed)
It was good to see you today. We have reviewed your prior records including labs and tests today Test(s) ordered today. Your results will be released to MyChart (or called to you) after review, usually within 72hours after test completion. If any changes need to be made, you will be notified at that same time. Continue ibuprofen as needed for pain - Your prescription refill has been submitted to your pharmacy. Please take as directed and contact our office if you believe you are having problem(s) with the medication(s). Rib Contusion A rib contusion (bruise) can occur by a blow to the chest or by a fall against a hard object. Usually these will be much better in a couple weeks. If X-rays were taken today and there are no broken bones (fractures), the diagnosis of bruising is made. However, broken ribs may not show up for several days, or may be discovered later on a routine X-ray when signs of healing show up. If this happens to you, it does not mean that something was missed on the X-ray, but simply that it did not show up on the first X-rays. Earlier diagnosis will not usually change the treatment. HOME CARE INSTRUCTIONS   Avoid strenuous activity. Be careful during activities and avoid bumping the injured ribs. Activities that pull on the injured ribs and cause pain should be avoided, if possible.  For the first day or two, an ice pack used every 20 minutes while awake may be helpful. Put ice in a plastic bag and put a towel between the bag and the skin.  Eat a normal, well-balanced diet. Drink plenty of fluids to avoid constipation.  Take deep breaths several times a day to keep lungs free of infection. Try to cough several times a day. Splint the injured area with a pillow while coughing to ease pain. Coughing can help prevent pneumonia.  Wear a rib belt or binder only if told to do so by your caregiver. If you are wearing a rib belt or binder, you must do the breathing exercises as directed by  your caregiver. If not used properly, rib belts or binders restrict breathing which can lead to pneumonia.  Only take over-the-counter or prescription medicines for pain, discomfort, or fever as directed by your caregiver. SEEK MEDICAL CARE IF:   You or your child has an oral temperature above 102 F (38.9 C).  Your baby is older than 3 months with a rectal temperature of 100.5 F (38.1 C) or higher for more than 1 day.  You develop a cough, with thick or bloody sputum. SEEK IMMEDIATE MEDICAL CARE IF:   You have difficulty breathing.  You feel sick to your stomach (nausea), have vomiting or belly (abdominal) pain.  You have worsening pain, not controlled with medications, or there is a change in the location of the pain.  You develop sweating or radiation of the pain into the arms, jaw or shoulders, or become light headed or faint.  You or your child has an oral temperature above 102 F (38.9 C), not controlled by medicine.  Your or your baby is older than 3 months with a rectal temperature of 102 F (38.9 C) or higher.  Your baby is 40 months old or younger with a rectal temperature of 100.4 F (38 C) or higher. MAKE SURE YOU:   Understand these instructions.  Will watch your condition.  Will get help right away if you are not doing well or get worse. Document Released: 11/12/2000 Document Revised: 05/12/2011  Document Reviewed: 10/06/2007 [facility] Patient Information 2013 [place], [place].

## 2012-05-31 NOTE — Progress Notes (Signed)
  Subjective:    Patient ID: [name] [name], male    DOB: 1940/07/05, 72 y.o.   MRN: [id number]  Injury The incident occurred more than 1 week ago. The incident occurred at home (walking on sidewalk). The injury mechanism was a fall and a direct blow. There is an injury to the chest. The pain is moderate. It is unlikely that a foreign body is present. Associated symptoms include chest pain. Pertinent negatives include no abnormal behavior, coughing, difficulty breathing, fussiness, headaches, hearing loss, inability to bear weight, light-headedness, loss of consciousness, memory loss, nausea, neck pain, numbness, seizures, tingling or weakness. ("sharp pain beneath and inside right breast with deep inspiration") There have been no prior injuries to these areas.    Past Medical History  Diagnosis Date  . BACKACHE NOS   . Carbuncle and furuncle of trunk   . Sebaceous cyst     Review of Systems  HENT: Negative for hearing loss and neck pain.   Respiratory: Negative for cough.   Cardiovascular: Positive for chest pain.  Gastrointestinal: Negative for nausea.  Neurological: Negative for tingling, seizures, loss of consciousness, weakness, light-headedness, numbness and headaches.  Psychiatric/Behavioral: Negative for memory loss.       Objective:   Physical Exam BP 120/72  Pulse 69  Temp(Src) 98.3 F (36.8 C) (Oral)  Wt 152 lb 12.8 oz (69.31 kg)  BMI 22.55 kg/m2  SpO2 97% Constitutional:  He appears well-developed and well-nourished. No distress.  Neck: Normal range of motion. Neck supple. No JVD present. No thyromegaly present.  Cardiovascular: Normal rate, regular rhythm and normal heart sounds.  No murmur heard. no BLE edema Pulmonary/Chest: Effort normal and breath sounds normal. No respiratory distress. no wheezes.  Chest wall - tender to palpation anterior right chest wall beneath breast, no bruising evident Abdominal: Soft. Bowel sounds are normal. Patient exhibits no  distension. There is no tenderness.  Neurological: he is alert and oriented to person, place, and time. Coordination normal.  Psychiatric: he has a normal mood and affect. behavior is normal. Judgment and thought content normal.   Lab Results  Component Value Date   WBC 5.8 01/13/2011   HGB 14.4 01/13/2011   HCT 42.3 01/13/2011   PLT 247.0 01/13/2011   GLUCOSE 95 12/03/2011   CHOL 177 01/13/2011   TRIG 93.0 01/13/2011   HDL 48.80 01/13/2011   LDLCALC 110* 01/13/2011   ALT 17 12/03/2011   AST 23 12/03/2011   NA 136 12/03/2011   K 4.4 12/03/2011   CL 99 12/03/2011   CREATININE 0.7 12/03/2011   BUN 15 12/03/2011   CO2 31 12/03/2011   TSH 1.68 12/28/2006   PSA 0.80 12/28/2006       Assessment & Plan:   Right-sided rib pain - suspect contusion from direct trauma following accidental fall 10 days ago Pleurisy - related to same  Exam with tenderness but otherwise benign Check plain film to exclude fracture Continue ibuprofen each bedtime as needed given pain relief with same, refills provided Patient education provided Followup if symptoms worse or unimproved

## 2012-07-05 ENCOUNTER — Ambulatory Visit: Payer: Medicare Other | Admitting: Internal Medicine

## 2012-07-06 ENCOUNTER — Ambulatory Visit (INDEPENDENT_AMBULATORY_CARE_PROVIDER_SITE_OTHER)
Admission: RE | Admit: 2012-07-06 | Discharge: 2012-07-06 | Disposition: A | Discharge status: Home / Self Care | Payer: Medicare Other | Source: Ambulatory Visit | Attending: Internal Medicine | Admitting: Internal Medicine

## 2012-07-06 ENCOUNTER — Ambulatory Visit (INDEPENDENT_AMBULATORY_CARE_PROVIDER_SITE_OTHER): Payer: Medicare Other | Admitting: Internal Medicine

## 2012-07-06 ENCOUNTER — Encounter: Payer: Self-pay | Admitting: Internal Medicine

## 2012-07-06 VITALS — BP 130/82 | HR 67 | Temp 98.2°F | Wt 151.8 lb

## 2012-07-06 DIAGNOSIS — R0789 Other chest pain: Secondary | ICD-10-CM

## 2012-07-06 DIAGNOSIS — IMO0001 Reserved for inherently not codable concepts without codable children: Secondary | ICD-10-CM

## 2012-07-06 DIAGNOSIS — S2231XD Fracture of one rib, right side, subsequent encounter for fracture with routine healing: Secondary | ICD-10-CM

## 2012-07-06 DIAGNOSIS — R071 Chest pain on breathing: Secondary | ICD-10-CM

## 2012-07-06 NOTE — Progress Notes (Signed)
Subjective:    Patient ID: [name] [name], male    DOB: [birth year], 72 y.o.   MRN: [id number]  HPI  Here for followup on rib fracture Traumatic fall March 2014on to right anterior chest -  minimally displaced fracture anterior sixth rib identified on chest x-ray, no pneumothorax or other complication Pain symptoms have largely improved on R side Notes L chest wall pain as well for past 6 weeks, esp lying on L side in bed - ?unrecognized fracture on L Has begun aspirin 81 mg daily for same which improves pain symptoms No exertional pain symptoms Would like to resume walking activity if "cleared"  Past Medical History  Diagnosis Date  . BACKACHE NOS   . Carbuncle and furuncle of trunk   . Sebaceous cyst   . Right rib fracture 05/2012    traumatic, min displaced on cxr     Review of Systems  Constitutional: Negative for fatigue and unexpected weight change.  Respiratory: Negative for cough, chest tightness and shortness of breath.   Cardiovascular: Positive for chest pain (see HPI). Negative for palpitations and leg swelling.       Objective:   Physical Exam BP 130/82  Pulse 67  Temp(Src) 98.2 F (36.8 C) (Oral)  Wt 151 lb 12.8 oz (68.856 kg)  BMI 22.41 kg/m2  SpO2 98% Wt Readings from Last 3 Encounters:  07/06/12 151 lb 12.8 oz (68.856 kg)  05/31/12 152 lb 12.8 oz (69.31 kg)  12/03/11 150 lb (68.04 kg)   Constitutional: he appears well-developed and well-nourished. No distress.  HENT: Head: Normocephalic and atraumatic. Ears: B TMs ok, no erythema or effusion; Nose: Nose normal. Mouth/Throat: Oropharynx is clear and moist. No oropharyngeal exudate.  Eyes: Conjunctivae and EOM are normal. Pupils are equal, round, and reactive to light. No scleral icterus.  Neck: Normal range of motion. Neck supple. No JVD present. No thyromegaly present.  Cardiovascular: Normal rate, regular rhythm and normal heart sounds.  No murmur heard. No BLE edema. Chest wall nontender to  palpation over right anterior chest and left rib cage Pulmonary/Chest: Effort normal and breath sounds normal. No respiratory distress. he has no wheezes.  Skin: Skin is warm and dry. No rash noted. No erythema.  Psychiatric: he has a normal mood and affect. behavior is normal. Judgment and thought content normal.  Lab Results  Component Value Date   WBC 5.8 01/13/2011   HGB 14.4 01/13/2011   HCT 42.3 01/13/2011   PLT 247.0 01/13/2011   GLUCOSE 95 12/03/2011   CHOL 177 01/13/2011   TRIG 93.0 01/13/2011   HDL 48.80 01/13/2011   LDLCALC 110* 01/13/2011   ALT 17 12/03/2011   AST 23 12/03/2011   NA 136 12/03/2011   K 4.4 12/03/2011   CL 99 12/03/2011   CREATININE 0.7 12/03/2011   BUN 15 12/03/2011   CO2 31 12/03/2011   TSH 1.68 12/28/2006   PSA 0.80 12/28/2006   Dg Ribs Unilateral Right  05/31/2012  *RADIOLOGY REPORT*  Clinical Data: 72 year old male with recent fall.  Right anterior rib pain.  RIGHT RIBS - 2 VIEW  Comparison: 10/03/2008.  Findings: No right pleural effusion or pneumothorax identified. Visible right lung parenchyma appears stable.  Minimally-displaced fracture of the anterior right sixth rib identified.  Scoliosis re- identified.  Grossly normal alignment of the osseous structures of the right shoulder.  IMPRESSION: Minimally-displaced right anterior sixth rib fracture.  No pneumothorax or pleural effusion identified.   Original Report Authenticated By: Erskine Speed, M.D.  Assessment & Plan:   Chest wall pain - suspect post traumatic -  ECG today without ischemic change Recheck CXR today - f/u known R 6th fx and rule out L side injury If normal CXR, ok to resume walking exercise as tolerated and change ASA 81 to prn only

## 2012-07-06 NOTE — Patient Instructions (Signed)
It was good to see you today. We have reviewed your prior records including labs and tests today ECG today does not show any heart concerns Test(s) ordered today. Your results will be released to MyChart (or called to you) after review, usually within 72hours after test completion. If any changes need to be made, you will be notified at that same time. If these tests all are normal, we will clear you to resume normal exercise ( walking) and discontinue aspirin 81 mg daily -except as needed for pain

## 2012-12-29 ENCOUNTER — Encounter: Payer: Self-pay | Admitting: Internal Medicine

## 2012-12-29 ENCOUNTER — Ambulatory Visit (INDEPENDENT_AMBULATORY_CARE_PROVIDER_SITE_OTHER): Payer: Medicare Other | Admitting: Internal Medicine

## 2012-12-29 ENCOUNTER — Other Ambulatory Visit (INDEPENDENT_AMBULATORY_CARE_PROVIDER_SITE_OTHER): Payer: Medicare Other

## 2012-12-29 VITALS — BP 116/70 | HR 68 | Temp 97.6°F | Ht 69.0 in | Wt 148.0 lb

## 2012-12-29 DIAGNOSIS — Z0000 Encounter for general adult medical examination without abnormal findings: Secondary | ICD-10-CM

## 2012-12-29 DIAGNOSIS — M549 Dorsalgia, unspecified: Secondary | ICD-10-CM

## 2012-12-29 LAB — COMPREHENSIVE METABOLIC PANEL
ALT: 14 U/L (ref 0–53)
BUN: 11 mg/dL (ref 6–23)
CO2: 32 mEq/L (ref 19–32)
Calcium: 10.2 mg/dL (ref 8.4–10.5)
Chloride: 96 mEq/L (ref 96–112)
Creatinine, Ser: 0.7 mg/dL (ref 0.4–1.5)
GFR: 113.39 mL/min (ref >60.00)
Glucose, Bld: 96 mg/dL (ref 70–99)
Total Bilirubin: 0.8 mg/dL (ref 0.3–1.2)

## 2012-12-29 LAB — TSH: TSH: 1.42 u[IU]/mL (ref 0.35–5.50)

## 2012-12-29 NOTE — Progress Notes (Signed)
Subjective:    Patient ID: [name] [name], male    DOB: 1940/12/01, 72 y.o.   MRN: [id number]  HPI The patient is here for annual Medicare wellness examination and management of other chronic and acute problems.  Reviewed CXR and explained the finding of emphysema. Reviewed L-S spine films in regard to back pain due to scoliosis-lumbar.  The risk factors are reflected in the social history.  The roster of all physicians providing medical care to patient - is listed in the Snapshot section of the chart.  Activities of daily living:  The patient is 100% inedpendent in all ADLs: dressing, toileting, feeding as well as independent mobility  Home safety : The patient has smoke detectors in the home. Falls - x 1, tripped in the dark over curb - sustained R6 fib fracture. They wear seatbelts. No firearms at home  There is no violence in the home.   There is no risks for hepatitis, STDs or HIV. There is no   history of blood transfusion. They have no travel history to infectious disease endemic areas of the world.  The patient has seen their dentist in the last six month. They have seen their eye doctor in the last year. They deny  any hearing difficulty and have not had audiologic testing in the last year.    They do not  have excessive sun exposure. Discussed the need for sun protection: hats, long sleeves and use of sunscreen if there is significant sun exposure.   Diet: the importance of a healthy diet is discussed. They do have a healthy  diet.  The patient has a regular exercise program: walking/light weight machine exercise  , 60 min duration, 3 per week.  The benefits of regular aerobic exercise were discussed.   Depression screen: there are no signs or vegative symptoms of depression- irritability, change in appetite, anhedonia, sadness/tearfullness.  Cognitive assessment: the patient manages all their financial and personal affairs and is actively engaged.   The following portions  of the patient's history were reviewed and updated as appropriate: allergies, current medications, past family history, past medical history,  past surgical history, past social history  and problem list.  Vision, hearing, body mass index were assessed and reviewed.   During the course of the visit the patient was educated and counseled about appropriate screening and preventive services including : fall prevention , diabetes screening, nutrition counseling, colorectal cancer screening, and recommended immunizations.  Past Medical History  Diagnosis Date  . BACKACHE NOS   . Carbuncle and furuncle of trunk   . Sebaceous cyst   . Right rib fracture 05/2012    traumatic, min displaced on cxr   Past Surgical History  Procedure Laterality Date  . Tonsillectomy    . Excisional hemorrhoidectomy     Family History  Problem Relation Age of Onset  . Alzheimer's disease Mother   . Diabetes Mother   . Diabetes Father   . Cirrhosis Paternal Grandfather    History   Social History  . Marital Status: Married    Spouse Name: N/A    Number of Children: N/A  . Years of Education: N/A   Occupational History  . Not on file.   Social History Main Topics  . Smoking status: Former Games developer  . Smokeless tobacco: Not on file  . Alcohol Use: No  . Drug Use: No  . Sexual Activity: Not on file   Other Topics Concern  . Not on file   Social  History Narrative   Married 1968   Retired Psychologist, educational; very active in Secretary/administrator   SO in good health-well controlled blood pressure, obesity    Current Outpatient Prescriptions on File Prior to Visit  Medication Sig Dispense Refill  . Ascorbic Acid (VITAMIN C WITH ROSE HIPS) 1000 MG tablet Take 1,000 mg by mouth daily.        Marland Kitchen aspirin 81 MG tablet Take 81 mg by mouth daily.      . Calcium Carbonate-Vitamin D (CALCIUM 600-D) 600-400 MG-UNIT per tablet Take 1 tablet by mouth daily.        Marland Kitchen ibuprofen (ADVIL,MOTRIN) 800 MG tablet Take 1 tablet (800 mg total)  by mouth every 8 (eight) hours as needed for pain.  90 tablet  3  . Multiple Vitamins-Minerals (MENS MULTIVITAMIN PLUS PO) Take by mouth daily.        . Saw Palmetto 450 MG CAPS Take 900 mg by mouth 2 (two) times daily.       . vitamin B-12 (CYANOCOBALAMIN) 1000 MCG tablet Take 1,000 mcg by mouth every other day.       . vitamin E (VITAMIN E) 400 UNIT capsule Take 400 Units by mouth every other day.        No current facility-administered medications on file prior to visit.       Review of Systems Constitutional:  Negative for fever, chills, activity change and unexpected weight change.  HEENT:  Negative for hearing loss, ear pain, congestion, neck stiffness and postnasal drip. Negative for sore throat or swallowing problems. Negative for dental complaints.   Eyes: Negative for vision loss or change in visual acuity.  Respiratory: Negative for chest tightness and wheezing. Negative for DOE.   Cardiovascular: Negative for chest pain or palpitations. No decreased exercise tolerance Gastrointestinal: No change in bowel habit. No bloating or gas. No reflux or indigestion Genitourinary: Negative for urgency, frequency, flank pain and difficulty urinating.  Musculoskeletal: Negative for myalgias, back pain and gait problem. Left hip pain. Neurological: Negative for dizziness, tremors, weakness and headaches.  Hematological: Negative for adenopathy.  Psychiatric/Behavioral: Negative for behavioral problems and dysphoric mood.       Objective:   Physical Exam Filed Vitals:   12/29/12 1026  BP: 116/70  Pulse: 68  Temp: 97.6 F (36.4 C)   Wt Readings from Last 3 Encounters:  12/29/12 148 lb (67.132 kg)  07/06/12 151 lb 12.8 oz (68.856 kg)  05/31/12 152 lb 12.8 oz (69.31 kg)   Gen'l: Well nourished well developed male in no acute distress  HEENT: Head: Normocephalic and atraumatic. Right Ear: External ear normal. EAC/TM nl. Left Ear: External ear normal.  EAC/TM nl. Nose: Nose normal.  Mouth/Throat: Oropharynx is clear and moist. Dentition - native, in good repair but missing several teeth and has an upper partial. No buccal or palatal lesions. Posterior pharynx clear. Eyes: Conjunctivae and sclera clear. EOM intact. Pupils are equal, round, and reactive to light. Right eye exhibits no discharge. Left eye exhibits no discharge. Neck: Normal range of motion. Neck supple. No JVD present. No tracheal deviation present. No thyromegaly present.  Cardiovascular: Normal rate, regular rhythm, no gallop, no friction rub, no murmur heard.      Quiet precordium. 2+ radial and DP pulses . No carotid bruits Pulmonary/Chest: Effort normal. No respiratory distress or increased WOB, no wheezes, no rales. No chest wall deformity or CVAT. Abdomen: Soft. Bowel sounds are normal in all quadrants. He exhibits no distension, no tenderness, no rebound  or guarding, No heptosplenomegaly  Genitourinary:  deferred Musculoskeletal: Normal range of motion. He exhibits no edema and no tenderness.       Small and large joints without redness, synovial thickening or deformity. Full range of motion preserved about all small, median and large joints. Leftward scoliosis lumbar spine noted.  Lymphadenopathy:    He has no cervical or supraclavicular adenopathy.  Neurological: He is alert and oriented to person, place, and time. CN II-XII intact. DTRs 2+ and symmetrical biceps, radial and patellar tendons. Cerebellar function normal with no tremor, rigidity, normal gait and station.  Skin: Skin is warm and dry. No rash noted. No erythema.  Psychiatric: He has a normal mood and affect. His behavior is normal. Thought content normal.   Recent Results (from the past 2160 hour(s))  COMPREHENSIVE METABOLIC PANEL     Status: None   Collection Time    12/29/12 11:29 AM      Result Value Range   Sodium 135  135 - 145 mEq/L   Potassium 4.4  3.5 - 5.1 mEq/L   Chloride 96  96 - 112 mEq/L   CO2 32  19 - 32 mEq/L   Glucose,  Bld 96  70 - 99 mg/dL   BUN 11  6 - 23 mg/dL   Creatinine, Ser 0.7  0.4 - 1.5 mg/dL   Total Bilirubin 0.8  0.3 - 1.2 mg/dL   Alkaline Phosphatase 85  39 - 117 U/L   AST 20  0 - 37 U/L   ALT 14  0 - 53 U/L   Total Protein 6.9  6.0 - 8.3 g/dL   Albumin 4.2  3.5 - 5.2 g/dL   Calcium 47.8  8.4 - 29.5 mg/dL   GFR 621.30  >86.57 mL/min  TSH     Status: None   Collection Time    12/29/12 11:29 AM      Result Value Range   TSH 1.42  0.35 - 5.50 uIU/mL         Assessment & Plan:

## 2012-12-29 NOTE — Patient Instructions (Addendum)
Very good to see you.  Emphysema - on x-ray there are changes consistent with mild emphysema but you are asymptomatic. Nothing needs to be done. (see handout)  Low back pain - this is all musculo-skeletal and related to you scoliosis. If you massage therapist does not provide education in regard to back health you may want to consult Dr. Denice Paradise  Lab today and results will be on MyChart.  You are current with all health maintenance. You will get a flu shot today and please return in 2+ weeks for Prevnar pneumonia vaccine Chronic Obstructive Pulmonary Disease Chronic obstructive pulmonary disease (COPD) is a condition in which airflow from the lungs is restricted. The lungs can never return to normal, but there are measures you can take which will improve them and make you feel better. CAUSES   Smoking.  Exposure to secondhand smoke.  Breathing in irritants such as air pollution, dust, cigarette smoke, strong odors, aerosol sprays, or paint fumes.  History of lung infections. SYMPTOMS   Deep, persistent (chronic) cough with a large amount of thick mucus.  Wheezing.  Shortness of breath, especially with physical activity.  Feeling like you cannot get enough air.  Difficulty breathing.  Rapid breaths (tachypnea).  Gray or bluish discoloration (cyanosis) of the skin, especially in fingers, toes, or lips.  Fatigue.  Weight loss.  Swelling in legs, ankles, or feet.  Fast heartbeat (tachycardia).  Frequent lung infections.   Chest tightness. DIAGNOSIS  Initial diagnosis may be based on your history, symptoms, and physical examination. Additional tests for COPD may include:  Chest X-ray.  Computed tomography (CT) scan.  Lung (pulmonary) function tests.  Blood tests. TREATMENT  Treatment focuses on making you comfortable (supportive care). Your caregiver may prescribe medicines (inhaled or pills) to help improve your breathing. Additional treatment options may  include oxygen therapy and pulmonary rehabilitation. Treatment should also include reducing your exposure to known irritants and following a plan to stop smoking. HOME CARE INSTRUCTIONS   Take all medicines, including antibiotic medicines, as directed by your caregiver.  Use inhaled medicines as directed by your caregiver.  Avoid medicines or cough syrups that dry up your airway (antihistamines) and slow down the elimination of secretions. This decreases respiratory capacity and may lead to infections.  If you smoke, stop smoking.  Avoid exposure to smoke, chemicals, and fumes that aggravate your breathing.  Avoid contact with individuals that have a contagious illness.  Avoid extreme temperature and humidity changes.  Use humidifiers at home and at your bedside if they do not make breathing difficult.  Drink enough water and fluids to keep your urine clear or pale yellow. This loosens secretions.  Eat healthy foods. Eating smaller, more frequent meals and resting before meals may help you maintain your strength.  Ask your caregiver about the use of vitamins and mineral supplements.  Stay active. Exercise and physical activity will help maintain your ability to do things you want to do.  Balance activity with periods of rest.  Assume a position of comfort if you become short of breath.  Learn and use relaxation techniques.  Learn and use controlled breathing techniques as directed by your caregiver. Controlled breathing techniques include:  Pursed lip breathing. This breathing technique starts with breathing in (inhaling) through your nose for 1 second. Next, purse your lips as if you were going to whistle. Then breathe out (exhale) through the pursed lips for 2 seconds.  Diaphragmatic breathing. Start by putting one hand on your abdomen  just above your waist. Inhale slowly through your nose. The hand on your abdomen should move out. Then exhale slowly through pursed lips. You  should be able to feel the hand on your abdomen moving in as you exhale.  Learn and use controlled coughing to clear mucus from your lungs. Controlled coughing is a series of short, progressive coughs. The steps of controlled coughing are: 1. Lean your head slightly forward. 2. Breathe in deeply using diaphragmatic breathing. 3. Try to hold your breath for 3 seconds. 4. Keep your mouth slightly open while coughing twice. 5. Spit any mucus out into a tissue. 6. Rest and repeat the steps once or twice as needed.  Receive all protective vaccines your caregiver suggests, especially pneumococcal and influenza vaccines.  Learn to manage stress.  Schedule and attend all follow-up appointments as directed by your caregiver. It is important to keep all your appointments.  Participate in pulmonary rehabilitation as directed by your caregiver.  Use home oxygen as suggested. SEEK MEDICAL CARE IF:   You are coughing up more mucus than usual.  There is a change in the color or thickness of the mucus.  Breathing is more labored than usual.  Your breathing is faster than usual.  Your skin color is more cyanotic than usual.  You are running out of the medicine you take for your breathing.  You are anxious, apprehensive, or restless.  You have a fever. SEEK IMMEDIATE MEDICAL CARE IF:   You have a rapid heart rate.  You have shortness of breath while you are resting.  You have shortness of breath that prevents you from being able to talk.  You have shortness of breath that prevents you from performing your usual physical activities.  You have chest pain lasting longer than 5 minutes.  You have a seizure.  Your family or friends notice that you are agitated or confused. MAKE SURE YOU:   Understand these instructions.  Will watch your condition.  Will get help right away if you are not doing well or get worse. Document Released: 11/27/2004 Document Revised: 11/12/2011 Document  Reviewed: 04/19/2010 [facility] Patient Information 2014 [place], [place].

## 2012-12-30 NOTE — Assessment & Plan Note (Signed)
Interval history notable for a fall with subsequent rib fracture. Otherwise, he has been healthy. Physical exam notable for leftward lumbar scoliosis. Reviewed previous lab - lipid panel, TSH with no need for repeat lab at this time. Cmet done today is normal. He is current with colorectal cancer screening and has aged out of prostate cancer screening.  In summary  A very nice man who is medically stable and doing well.

## 2012-12-30 NOTE — Assessment & Plan Note (Signed)
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 He gets relief with massage therapy. He has not been instructed in any home exercise or treatment.  Plan Continue massage therapy  Ask for instruction from his therapist, if no instruction provided consult Denice Paradise

## 2013-01-06 ENCOUNTER — Other Ambulatory Visit: Payer: Self-pay

## 2013-01-13 ENCOUNTER — Ambulatory Visit (INDEPENDENT_AMBULATORY_CARE_PROVIDER_SITE_OTHER): Payer: Medicare Other

## 2013-01-13 ENCOUNTER — Other Ambulatory Visit: Payer: Self-pay

## 2013-01-13 DIAGNOSIS — Z23 Encounter for immunization: Secondary | ICD-10-CM

## 2013-01-13 MED ORDER — IBUPROFEN 800 MG PO TABS: 800.0000 mg | ORAL_TABLET | Freq: Three times a day (TID) | ORAL | Status: DC | PRN

## 2013-03-21 ENCOUNTER — Ambulatory Visit: Payer: Medicare Other | Admitting: Internal Medicine

## 2013-03-21 DIAGNOSIS — Z0289 Encounter for other administrative examinations: Secondary | ICD-10-CM

## 2013-03-23 ENCOUNTER — Encounter: Payer: Self-pay | Admitting: Internal Medicine

## 2013-03-23 ENCOUNTER — Ambulatory Visit (INDEPENDENT_AMBULATORY_CARE_PROVIDER_SITE_OTHER): Payer: Medicare HMO | Admitting: Internal Medicine

## 2013-03-23 VITALS — BP 128/80 | HR 68 | Temp 98.2°F | Wt 151.4 lb

## 2013-03-23 DIAGNOSIS — D485 Neoplasm of uncertain behavior of skin: Secondary | ICD-10-CM

## 2013-03-23 NOTE — Progress Notes (Signed)
Pre visit review using our clinic review tool, if applicable. No additional management support is needed unless otherwise documented below in the visit note. 

## 2013-03-24 NOTE — Progress Notes (Signed)
Subjective:    Patient ID: [name] [name], male    DOB: 1940-05-28, 73 y.o.   MRN: [id number]  HPI [name] [name] presents for evaluation of a changing skin lesion on his back. He reports that there has been an increase in size of this lesion. There is no pruritis or pain. The location is such that working the weight machines causes irritation.  PMH, FamHx and SocHx reviewed for any changes and relevance.  Current Outpatient Prescriptions on File Prior to Visit  Medication Sig Dispense Refill  . Ascorbic Acid (VITAMIN C WITH ROSE HIPS) 1000 MG tablet Take 1,000 mg by mouth daily.        Marland Kitchen aspirin 81 MG tablet Take 81 mg by mouth every other day.       . Calcium Carbonate-Vitamin D (CALCIUM 600-D) 600-400 MG-UNIT per tablet Take 1 tablet by mouth daily.        Marland Kitchen ibuprofen (ADVIL,MOTRIN) 800 MG tablet Take 1 tablet (800 mg total) by mouth every 8 (eight) hours as needed.  90 tablet  3  . Multiple Vitamins-Minerals (MENS MULTIVITAMIN PLUS PO) Take by mouth daily.        . Saw Palmetto 450 MG CAPS Take 900 mg by mouth 2 (two) times daily.       . vitamin B-12 (CYANOCOBALAMIN) 1000 MCG tablet Take 1,000 mcg by mouth every other day.       . vitamin E (VITAMIN E) 400 UNIT capsule Take 400 Units by mouth every other day.        No current facility-administered medications on file prior to visit.      Review of Systems System review is negative for any constitutional, cardiac, pulmonary, GI or neuro symptoms or complaints other than as described in the HPI.     Objective:   Physical Exam Filed Vitals:   03/23/13 1639  BP: 128/80  Pulse: 68  Temp: 98.2 F (36.8 C)   Gen'l- WNWD man in no distress Cor - RRR Pulm - normal  Derm - 2 x 2 cm dark raised variegated in color lesion right back at T7 level   Procedure Note :     Procedure :  Shave Skin biopsy   Indication:  Changing mole    Risks including unsuccessful procedure , bleeding, infection, bruising, scar, a need for another  complete procedure and others were explained to the patient in detail as well as the benefits. Informed consent was obtained and signed.   The patient was placed in a decubitus position.  Lesion #1 on     Measuring 20 x 20 mm   Skin over lesion #1  was prepped with Betadine and alcohol  and anesthetized with 1 cc of 2% lidocaine and epinephrine, using a 25-gauge 1 inch needle.  Shave biopsy with a sterile Dermablade was carried out in the usual fashion. It was attempted to biopsy with  1 mm free margins.  Hyfrecator was used to destroy the rest of the lesion potentially left behind and for hemostasis. Band-Aid was applied.     Tolerated well. Complications none.    Postprocedure instructions :    A Band-Aid should be  changed twice daily. You can take a shower tomorrow.  Keep the wounds clean. You can wash them with liquid soap and water. Pat dry with gauze or a Kleenex tissue. Apply a Band-Aid.   You need to report immediately  if fever, chills or any signs of infection develop.  The biopsy results should be available in 1 -2 weeks.        Assessment & Plan:

## 2013-03-24 NOTE — Addendum Note (Signed)
Addended by: Shelly Coss on: 03/24/2013 08:38 AM   Modules accepted: Orders

## 2013-06-17 ENCOUNTER — Telehealth: Payer: Self-pay | Admitting: Internal Medicine

## 2013-06-17 NOTE — Telephone Encounter (Signed)
Referral for Dr Darleen Crocker faxed to [employer] @ [phone], awaiting approval

## 2013-06-22 NOTE — Telephone Encounter (Signed)
Referral # [id number] start date 08/05/13 exp 11/03/13 good for 4 visits

## 2013-10-17 ENCOUNTER — Telehealth: Payer: Self-pay | Admitting: Internal Medicine

## 2013-10-17 ENCOUNTER — Emergency Department (INDEPENDENT_AMBULATORY_CARE_PROVIDER_SITE_OTHER): Payer: Medicare HMO

## 2013-10-17 ENCOUNTER — Emergency Department (HOSPITAL_COMMUNITY)
Admission: EM | Admit: 2013-10-17 | Discharge: 2013-10-17 | Disposition: A | Discharge status: Home / Self Care | Payer: Commercial Managed Care - HMO | Source: Home / Self Care | Attending: Family Medicine | Admitting: Family Medicine

## 2013-10-17 ENCOUNTER — Encounter (HOSPITAL_COMMUNITY): Payer: Self-pay | Admitting: Family Medicine

## 2013-10-17 DIAGNOSIS — G5761 Lesion of plantar nerve, right lower limb: Secondary | ICD-10-CM

## 2013-10-17 DIAGNOSIS — G5760 Lesion of plantar nerve, unspecified lower limb: Secondary | ICD-10-CM

## 2013-10-17 NOTE — ED Notes (Signed)
Right foot pain. Symptoms started Sunday, no injury. Hurts with movement. States doesn't hurt much if he doesn't move his toes.

## 2013-10-17 NOTE — Telephone Encounter (Signed)
Patient Information:  Caller Name: Reda  Phone: [phone]  Patient: [name] [name], [name] [name]  Gender: Male  DOB: 17-Feb-1941  Age: 73 Years  PCP: Other  Office Follow Up:  Does the office need to follow up with this patient?: No  Instructions For The Office: N/A   Symptoms  Reason For Call & Symptoms: Pt is calling to say he has pain in the ball of his right foot. Pt thinks it is a sports injury. He was calling to ask for a referral since Dr. Crissie Sickles retired. Pt describes the pain as intermittent and a 7/10. The pain is not constant.  Reviewed Health History In EMR: Yes  Reviewed Medications In EMR: Yes  Reviewed Allergies In EMR: Yes  Reviewed Surgeries / Procedures: Yes  Date of Onset of Symptoms: 10/16/2013  Guideline(s) Used:  Foot Pain  Disposition Per Guideline:   See Within 3 Days in Office  Reason For Disposition Reached:   Moderate pain (e.g., interferes with normal activities, limping) and present > 3 days  Advice Given:  N/A  Patient Will Follow Care Advice:  Pt refused. Wants an appt today but there are none available at this location. He [facility] to [facility].

## 2013-10-17 NOTE — ED Provider Notes (Signed)
CSN: [id number]     Arrival date & time 10/17/13  1637 History   None    Chief Complaint  Patient presents with  . Foot Pain    Right   (Consider location/radiation/quality/duration/timing/severity/associated sxs/prior Treatment) HPI  R foot pain: started yesterday afternoon. Started in balls of feet on R. Pt walks 4 miles QOD. Wears athletic shoes for exercise. Has not changed footwear for 1 year. Pain is ache. Yesterday had to stop walk 1 mile early. Improves w/ rest. Has not tried any medications. Denies swelling, trauma.   Past Medical History  Diagnosis Date  . BACKACHE NOS   . Carbuncle and furuncle of trunk   . Sebaceous cyst   . Right rib fracture 05/2012    traumatic, min displaced on cxr   Past Surgical History  Procedure Laterality Date  . Tonsillectomy    . Excisional hemorrhoidectomy     Family History  Problem Relation Age of Onset  . Alzheimer's disease Mother   . Diabetes Mother   . Diabetes Father   . Cirrhosis Paternal Grandfather    History  Substance Use Topics  . Smoking status: Former Research scientist (life sciences)  . Smokeless tobacco: Not on file  . Alcohol Use: No    Review of Systems Per HPI with all other pertinent systems negative.   Allergies  Review of patient's allergies indicates no known allergies.  Home Medications   Prior to Admission medications   Medication Sig Start Date End Date Taking? Authorizing Provider  Ascorbic Acid (VITAMIN C WITH ROSE HIPS) 1000 MG tablet Take 1,000 mg by mouth daily.     Yes Historical Provider, MD  aspirin 81 MG tablet Take 81 mg by mouth every other day.    Yes Historical Provider, MD  Calcium Carbonate-Vitamin D (CALCIUM 600-D) 600-400 MG-UNIT per tablet Take 1 tablet by mouth daily.     Yes Historical Provider, MD  ibuprofen (ADVIL,MOTRIN) 800 MG tablet Take 1 tablet (800 mg total) by mouth every 8 (eight) hours as needed. 01/13/13  Yes Neena Rhymes, MD  Multiple Vitamins-Minerals (MENS MULTIVITAMIN PLUS PO) Take by  mouth daily.     Yes Historical Provider, MD  Saw Palmetto 450 MG CAPS Take 900 mg by mouth 2 (two) times daily.    Yes Historical Provider, MD  vitamin B-12 (CYANOCOBALAMIN) 1000 MCG tablet Take 1,000 mcg by mouth every other day.    Yes Historical Provider, MD  vitamin E (VITAMIN E) 400 UNIT capsule Take 400 Units by mouth every other day.    Yes Historical Provider, MD   BP 144/77  Pulse 62  Temp(Src) 99.3 F (37.4 C) (Oral)  Resp 14  SpO2 97% Physical Exam  Constitutional: He is oriented to person, place, and time. He appears well-developed and well-nourished. No distress.  HENT:  Head: Normocephalic.  Eyes: EOM are normal. Pupils are equal, round, and reactive to light.  Neck: Normal range of motion.  Cardiovascular: Normal rate.   Pulmonary/Chest: Effort normal. No respiratory distress.  Abdominal: Soft.  Musculoskeletal:  Feet and toes bilat FROM. R foot ttp along the plantar surface at the MTPs from 1-4th. No edema or erythema. Arch preserved on standing. Ankle nml  Neurological: He is alert and oriented to person, place, and time. He exhibits normal muscle tone.  Skin: Skin is warm. No rash noted. He is not diaphoretic.  Psychiatric: He has a normal mood and affect. His behavior is normal. Thought content normal.    ED Course  Procedures (  including critical care time) Labs Review Labs Reviewed - No data to display  Imaging Review Dg Foot Complete Right  10/17/2013   CLINICAL DATA:  Right foot pain  EXAM: RIGHT FOOT COMPLETE - 3+ VIEW  COMPARISON:  None.  FINDINGS: There is no evidence of fracture or dislocation. There is no evidence of arthropathy or other focal bone abnormality. Soft tissues are unremarkable.  IMPRESSION: Negative.   Electronically Signed   By: Misty Stanley M.D.   On: 10/17/2013 18:49     MDM   1. Morton's metatarsalgia, right    Handouts given on Metatarsal pads Ibuprofen 600 Q6-8 x 2-5 days, ice, rest.  Pt to cross train and let pain be the  guide.  Pts shoes have nearly 600 miles on them.  Encouraged to buy new footwear every 300-400 miles.  Pt to return if not better for further imaging to look for stress fracture.  Precautions given and all questions answered  Linna Darner, MD Family Medicine 10/17/2013, 7:27 PM      Waldemar Dickens, MD 10/17/13 [phone]

## 2013-10-17 NOTE — Discharge Instructions (Signed)
You likely have metatarsalgia or strain of the metatarsals. There is no sign of fracture but if your pain does not improve or gets worse in 2 weeks come back for further xrays Please start taking your ibuprofen 600 every 6-8 hours for the next 2-5 days Please remember to buy new shoes every 3-400 miles max Please consider buying a metatarsal pad for support Consider cross training and let pain be your guide

## 2013-12-05 ENCOUNTER — Ambulatory Visit (INDEPENDENT_AMBULATORY_CARE_PROVIDER_SITE_OTHER): Payer: Commercial Managed Care - HMO | Admitting: Internal Medicine

## 2013-12-05 ENCOUNTER — Encounter: Payer: Self-pay | Admitting: Internal Medicine

## 2013-12-05 VITALS — BP 120/80 | HR 70 | Temp 97.8°F | Resp 18 | Ht 69.0 in | Wt 146.0 lb

## 2013-12-05 DIAGNOSIS — M4180 Other forms of scoliosis, site unspecified: Secondary | ICD-10-CM

## 2013-12-05 DIAGNOSIS — Z0000 Encounter for general adult medical examination without abnormal findings: Secondary | ICD-10-CM

## 2013-12-05 NOTE — Patient Instructions (Signed)
We will see you back in about 1 year. If you have problems or are sick before then please feel free to call us.  It was a pleasure to meet you today.

## 2013-12-05 NOTE — Assessment & Plan Note (Signed)
He gets pain with prolonged standing and does well with monthly massages and exercise regularly.

## 2013-12-05 NOTE — Progress Notes (Signed)
   Subjective:    Patient ID: [name] [name], male    DOB: Sep 25, 1940, 73 y.o.   MRN: [id number]  HPI The patient is a 73 YO man who is coming in to establish care. He has PMH of back ache and takes only vitamins. He is having a slight head cold right now and thinks the weather is affecting it. He is having some mild nose drainage, slight sore throat, no cough, no fevers, no chills, no ear pain or fullness, no sinus tenderness or fullness. He is not having any other complaints. He is retired and enjoying his retirement. He does power walk 4 miles every other day. No chest pain, SOB. He does get reflux at times but is able to modify his diet to avoid symptoms.   Review of Systems  Constitutional: Negative for fever, chills, activity change, appetite change and fatigue.  HENT: Positive for congestion and sore throat. Negative for ear pain and facial swelling.   Respiratory: Negative for cough, chest tightness, shortness of breath and wheezing.   Cardiovascular: Negative for chest pain, palpitations and leg swelling.  Gastrointestinal: Negative for abdominal pain, diarrhea, constipation and abdominal distention.  Genitourinary: Negative.   Musculoskeletal: Positive for back pain.       With standing for long periods of time.  Skin: Negative.   Neurological: Negative.       Objective:   Physical Exam  Constitutional: He is oriented to person, place, and time. He appears well-developed and well-nourished.  HENT:  Head: Normocephalic and atraumatic.  Mouth/Throat: Oropharynx is clear and moist.  Eyes: EOM are normal.  Neck: Normal range of motion.  Cardiovascular: Normal rate and regular rhythm.   Pulmonary/Chest: Effort normal and breath sounds normal. No respiratory distress. He has no wheezes. He has no rales.  Abdominal: Soft. Bowel sounds are normal.  Musculoskeletal: He exhibits no edema.  Lymphadenopathy:    He has no cervical adenopathy.  Neurological: He is alert and oriented to  person, place, and time. Coordination normal.  Skin: Skin is warm and dry.   Filed Vitals:   12/05/13 0900  BP: 120/80  Pulse: 70  Temp: 97.8 F (36.6 C)  TempSrc: Oral  Resp: 18  Height: 5\' 9"  (1.753 m)  Weight: 146 lb (66.225 kg)  SpO2: 98%      Assessment & Plan:

## 2013-12-05 NOTE — Assessment & Plan Note (Signed)
Will get flu shot next month, otherwise doing well. His diet is good and exercises regularly.

## 2013-12-05 NOTE — Progress Notes (Signed)
Pre visit review using our clinic review tool, if applicable. No additional management support is needed unless otherwise documented below in the visit note. 

## 2014-09-26 ENCOUNTER — Telehealth: Payer: Self-pay | Admitting: Internal Medicine

## 2014-09-26 DIAGNOSIS — H547 Unspecified visual loss: Secondary | ICD-10-CM

## 2014-09-26 NOTE — Telephone Encounter (Signed)
Patient need a referral for an eye exam [facility] to Dr Talbert Forest, please advise

## 2014-09-26 NOTE — Telephone Encounter (Signed)
Placed.

## 2014-10-10 DIAGNOSIS — H18413 Arcus senilis, bilateral: Secondary | ICD-10-CM | POA: Diagnosis not present

## 2014-10-10 DIAGNOSIS — H524 Presbyopia: Secondary | ICD-10-CM | POA: Diagnosis not present

## 2014-10-10 DIAGNOSIS — H2513 Age-related nuclear cataract, bilateral: Secondary | ICD-10-CM | POA: Diagnosis not present

## 2014-10-10 DIAGNOSIS — H02839 Dermatochalasis of unspecified eye, unspecified eyelid: Secondary | ICD-10-CM | POA: Diagnosis not present

## 2014-11-15 ENCOUNTER — Telehealth: Payer: Self-pay

## 2014-11-15 NOTE — Telephone Encounter (Signed)
Call to Educate regarding AWV; Agreed to come in on 9/27 prior to apt, as the prior week schedule was fairly busy Will see 9/27 at 1:45pm prior to Dr. Doug Sou at 2:30

## 2014-11-28 ENCOUNTER — Other Ambulatory Visit (INDEPENDENT_AMBULATORY_CARE_PROVIDER_SITE_OTHER): Payer: Commercial Managed Care - HMO

## 2014-11-28 ENCOUNTER — Encounter: Payer: Self-pay | Admitting: Internal Medicine

## 2014-11-28 ENCOUNTER — Ambulatory Visit (INDEPENDENT_AMBULATORY_CARE_PROVIDER_SITE_OTHER): Payer: Commercial Managed Care - HMO | Admitting: Internal Medicine

## 2014-11-28 VITALS — BP 126/80 | HR 70 | Temp 98.4°F | Resp 14 | Ht 66.0 in | Wt 149.5 lb

## 2014-11-28 DIAGNOSIS — L989 Disorder of the skin and subcutaneous tissue, unspecified: Secondary | ICD-10-CM

## 2014-11-28 DIAGNOSIS — Z0000 Encounter for general adult medical examination without abnormal findings: Secondary | ICD-10-CM

## 2014-11-28 DIAGNOSIS — E789 Disorder of lipoprotein metabolism, unspecified: Secondary | ICD-10-CM | POA: Diagnosis not present

## 2014-11-28 LAB — COMPREHENSIVE METABOLIC PANEL
ALK PHOS: 94 U/L (ref 39–117)
ALT: 10 U/L (ref 0–53)
AST: 16 U/L (ref 0–37)
Albumin: 4.2 g/dL (ref 3.5–5.2)
BUN: 13 mg/dL (ref 6–23)
CO2: 34 mEq/L — ABNORMAL HIGH (ref 19–32)
Calcium: 9.8 mg/dL (ref 8.4–10.5)
Chloride: 97 mEq/L (ref 96–112)
Creatinine, Ser: 0.76 mg/dL (ref 0.40–1.50)
GFR: 105.98 mL/min (ref >60.00)
GLUCOSE: 88 mg/dL (ref 70–99)
POTASSIUM: 4.4 meq/L (ref 3.5–5.1)
Sodium: 135 mEq/L (ref 135–145)
TOTAL PROTEIN: 6.5 g/dL (ref 6.0–8.3)
Total Bilirubin: 0.5 mg/dL (ref 0.2–1.2)

## 2014-11-28 LAB — PSA: PSA: 1.23 ng/mL (ref 0.10–4.00)

## 2014-11-28 LAB — LIPID PANEL
CHOL/HDL RATIO: 3
Cholesterol: 174 mg/dL (ref 0–200)
HDL: 55.8 mg/dL (ref >39.00)
LDL CALC: 95 mg/dL (ref 0–99)
NONHDL: 117.86
Triglycerides: 112 mg/dL (ref 0.0–149.0)
VLDL: 22.4 mg/dL (ref 0.0–40.0)

## 2014-11-28 MED ORDER — IBUPROFEN 800 MG PO TABS: 800.0000 mg | ORAL_TABLET | Freq: Three times a day (TID) | ORAL | Status: DC | PRN

## 2014-11-28 NOTE — Progress Notes (Signed)
Medical screening examination/treatment/procedure(s) were performed by non-physician practitioner and as supervising physician I was immediately available for consultation/collaboration. I agree with above. Elizabeth A Crawford, MD 

## 2014-11-28 NOTE — Progress Notes (Signed)
Subjective:   [name] [name] is a 74 y.o. male who presents for Medicare Annual/Subsequent preventive examination.  Review of Systems:  HRA assessment completed during visit; [place]  Patient is here for Annual Wellness Assessment  ROS deferred to CPE exam with physician today Pain/ Ibuprofen for pain in back;  manages it; 1 - 10 level 1 or 2; occasionally raise to 4 or 5;   BMI: 24 Diet; Mediterranean diet ; low sodium; low sugar  Overate a little for birthday Exercise; Hx of walking 4 miles per day (race walk) competing tomorrow a race/ walks through parks 5k / 1500 meters; 4 miles (66minutes) (13:44mile)  (3 days a week 0   SAFETY/ one level home ; handicapped accessible  Safety reviewed for the home; including removal of clutter; clear paths through the home,  railing as needed; bathroom safety; community safety; smoke detectors and firearms safety as well as sun protection;  Sun protection/ not out much; walks [facility]; walks to [employer] 4.5 miles; wears a hat to protect head and ears Stressors; retired 2003 and life is good  Medication review/ New meds no  Fall assessment / march 2014; fx rib; no falls this year Gait assessment normal; no deficits  Mobilization and Functional losses in the last year. no   Urinary or fecal incontinence reviewed/ ? Slow stream; request PSA   Counseling: Colonoscopy; 08/2005/ GI;  EKG 07/06/2012 Hearing: screening Ophthalmology exam; referral to r. Bevis at [facility] / annually had x 1 month ago Dental; regular visits  Immunizations Due  Flu; will hold until race this week and then will come in and take it by apt.    Cardiac Risk Factors include: advanced age (>49men, >79 women)     Objective:    Vitals: BP 126/80 mmHg  Pulse 70  Temp(Src) 98.4 F (36.9 C) (Oral)  Resp 14  Ht 5\' 6"  (1.676 m)  Wt 149 lb 8 oz (67.813 kg)  BMI 24.14 kg/m2  SpO2 98%  Tobacco History  Smoking status  . Former Smoker -- 3.00  packs/day for 15 years  . Types: Cigarettes  . Quit date: 03/04/1975  Smokeless tobacco  . 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 BACKACHE NOS   . Carbuncle and furuncle of trunk   . Sebaceous cyst   . Right rib fracture 05/2012    traumatic, min displaced on cxr   Past Surgical History  Procedure Laterality Date  . Tonsillectomy    . Excisional hemorrhoidectomy     Family History  Problem Relation Age of Onset  . Alzheimer's disease Mother   . Diabetes Mother   . Diabetes Father   . Cirrhosis Paternal Grandfather    History  Sexual Activity  . Sexual Activity: Not on file    Outpatient Encounter Prescriptions as of 11/28/2014  Medication Sig  . Ascorbic Acid (VITAMIN C WITH ROSE HIPS) 1000 MG tablet Take 1,000 mg by mouth daily.    Marland Kitchen aspirin 81 MG tablet Take 81 mg by mouth every other day.   . Calcium Carbonate-Vitamin D (CALCIUM 600-D) 600-400 MG-UNIT per tablet Take 1 tablet by mouth daily.    Marland Kitchen ibuprofen (ADVIL,MOTRIN) 800 MG tablet Take 1 tablet (800 mg total) by mouth every 8 (eight) hours as needed.  . Multiple Vitamins-Minerals (MENS MULTIVITAMIN PLUS PO) Take  by mouth daily.    . vitamin B-12 (CYANOCOBALAMIN) 1000 MCG tablet Take 1,000 mcg by mouth every other day.   . vitamin E (VITAMIN E) 400 UNIT capsule Take 400 Units by mouth every other day.   . Chlorpheniramine-APAP 2-325 MG TABS Take by mouth.   No facility-administered encounter medications on file as of 11/28/2014.    Activities of Daily Living In your present state of health, do you have any difficulty performing the following activities: 11/28/2014  Hearing? N  Vision? N  Difficulty concentrating or making decisions? N  Walking or climbing stairs? N  Dressing or bathing? N  Doing errands, shopping? N  Preparing Food and eating ? N  Using the Toilet? N  In the past six months, have you  accidently leaked urine? N  Do you have problems with loss of bowel control? N  Managing your Medications? N  Managing your Finances? N  Housekeeping or managing your Housekeeping? N    Patient Care Team: Hoyt Koch, MD as PCP - General (Internal Medicine) Darleen Crocker, MD (Ophthalmology) Izora Gala, MD (Otolaryngology)   Assessment:    Assessment   Today patient counseled on age appropriate routine health concerns for screening and prevention, each reviewed and up to date or declined. Immunizations reviewed and will take flu shot after race. Labs deferred for CPE or medical fup by MD.  Risk factors for depression reviewed and negative. Hearing function adequate and screen completed and visual acuity are intact. ADLs screened and addressed as needed. Functional ability and level of safety reviewed and appropriate. Educated on memory loss and AD8 Education, counseling and referrals performed based on assessed risks today. Patient provided with a copy of personalized plan for preventive services and due dates  FALL PREVENTION reviewed and is very active  HEPATITIS SCREEN reviewed; no risk identified  TOBACCO/ quit some time ago; post 15 years, Does state he has some lung compensation on the left; no issue with racing x 4 miles per day ETOH occasionally or other DRUG use was negative    Exercise Activities and Dietary recommendations Current Exercise Habits:: Home exercise routine, Time (Minutes): 40, Frequency (Times/Week): 3, Weekly Exercise (Minutes/Week): 120, Intensity: Moderate  Goals    . patient     STay active and stay relaxed       Fall Risk Fall Risk  11/28/2014  Falls in the past year? No   Depression Screen PHQ 2/9 Scores 11/28/2014  PHQ - 2 Score 0    Cognitive Testing MMSE - Mini Mental State Exam 11/28/2014  Not completed: (No Data)    Immunization History  Administered Date(s) Administered  . Influenza Split 12/03/2011  . Influenza Whole  11/21/2009  . Pneumococcal Conjugate-13 01/13/2013  . Pneumococcal Polysaccharide-23 11/20/2008  . Td 11/20/2008  . Zoster 11/21/2009   Screening Tests Health Maintenance  Topic Date Due  . INFLUENZA VACCINE  10/02/2014  . TETANUS/TDAP  11/21/2018  . ZOSTAVAX  Completed  . PNA vac Low Risk Adult  Completed      Plan:     Take flu shot after race this week   During the course of the visit the patient was educated and counseled about the following appropriate screening and preventive services:   Vaccines to include Pneumoccal, Influenza, Hepatitis B, Td, Zostavax, HCV  Electrocardiogram 07/2012  Cardiovascular Disease/ no risk   Colorectal cancer screening/ completed ; aged out  Diabetes screening/ n/a  Prostate Cancer Screening/ defer to Dr. Sharlet Salina  Glaucoma screening/  eyes checked annually   Nutrition counseling / good diet; low sugar and sodium  Smoking cessation counseling  Patient Instructions (the written plan) was given to the patient.    Wynetta Fines, RN  11/28/2014

## 2014-11-28 NOTE — Patient Instructions (Addendum)
Mr. [name] [name] , Thank you for taking time to come for your Medicare Wellness Visit. I appreciate your ongoing commitment to your health goals. Please review the following plan we discussed and let me know if I can assist you in the future.   Will postpone flu shot until race and will make apt to come in and take  These are the goals we discussed: Goals    . [name] [name]     STay active and stay relaxed        This is a list of the screening recommended for you and due dates:  Health Maintenance  Topic Date Due  . Flu Shot  10/02/2014  . Tetanus Vaccine  11/21/2018  . Shingles Vaccine  Completed  . Pneumonia vaccines  Completed     Health Maintenance A healthy lifestyle and preventative care can promote health and wellness.  Maintain regular health, dental, and eye exams.  Eat a healthy diet. Foods like vegetables, fruits, whole grains, low-fat dairy products, and lean protein foods contain the nutrients you need and are low in calories. Decrease your intake of foods high in solid fats, added sugars, and salt. Get information about a proper diet from your health care provider, if necessary.  Regular physical exercise is one of the most important things you can do for your health. Most adults should get at least 150 minutes of moderate-intensity exercise (any activity that increases your heart rate and causes you to sweat) each week. In addition, most adults need muscle-strengthening exercises on 2 or more days a week.   Maintain a healthy weight. The body mass index (BMI) is a screening tool to identify possible weight problems. It provides an estimate of body fat based on height and weight. Your health care provider can find your BMI and can help you achieve or maintain a healthy weight. For males 20 years and older:  A BMI below 18.5 is considered underweight.  A BMI of 18.5 to 24.9 is normal.  A BMI of 25 to 29.9 is considered overweight.  A BMI of 30 and above is considered  obese.  Maintain normal blood lipids and cholesterol by exercising and minimizing your intake of saturated fat. Eat a balanced diet with plenty of fruits and vegetables. Blood tests for lipids and cholesterol should begin at age 52 and be repeated every 5 years. If your lipid or cholesterol levels are high, you are over age 15, or you are at high risk for heart disease, you may need your cholesterol levels checked more frequently.Ongoing high lipid and cholesterol levels should be treated with medicines if diet and exercise are not working.  If you smoke, find out from your health care provider how to quit. If you do not use tobacco, do not start.  Lung cancer screening is recommended for adults aged 73-80 years who are at high risk for developing lung cancer because of a history of smoking. A yearly low-dose CT scan of the lungs is recommended for people who have at least a 30-pack-year history of smoking and are current smokers or have quit within the past 15 years. A pack year of smoking is smoking an average of 1 pack of cigarettes a day for 1 year (for example, a 30-pack-year history of smoking could mean smoking 1 pack a day for 30 years or 2 packs a day for 15 years). Yearly screening should continue until the smoker has stopped smoking for at least 15 years. Yearly screening should be stopped for  people who develop a health problem that would prevent them from having lung cancer treatment.  If you choose to drink alcohol, do not have more than 2 drinks per day. One drink is considered to be 12 oz (360 mL) of beer, 5 oz (150 mL) of wine, or 1.5 oz (45 mL) of liquor.  Avoid the use of street drugs. Do not share needles with anyone. Ask for help if you need support or instructions about stopping the use of drugs.  High blood pressure causes heart disease and increases the risk of stroke. Blood pressure should be checked at least every 1-2 years. Ongoing high blood pressure should be treated with  medicines if weight loss and exercise are not effective.  If you are 50-11 years old, ask your health care provider if you should take aspirin to prevent heart disease.  Diabetes screening involves taking a blood sample to check your fasting blood sugar level. This should be done once every 3 years after age 31 if you are at a normal weight and without risk factors for diabetes. Testing should be considered at a younger age or be carried out more frequently if you are overweight and have at least 1 risk factor for diabetes.  Colorectal cancer can be detected and often prevented. Most routine colorectal cancer screening begins at the age of 15 and continues through age 3. However, your health care provider may recommend screening at an earlier age if you have risk factors for colon cancer. On a yearly basis, your health care provider may provide home test kits to check for hidden blood in the stool. A small camera at the end of a tube may be used to directly examine the colon (sigmoidoscopy or colonoscopy) to detect the earliest forms of colorectal cancer. Talk to your health care provider about this at age 87 when routine screening begins. A direct exam of the colon should be repeated every 5-10 years through age 43, unless early forms of precancerous polyps or small growths are found.  People who are at an increased risk for hepatitis B should be screened for this virus. You are considered at high risk for hepatitis B if:  You were born in a country where hepatitis B occurs often. Talk with your health care provider about which countries are considered high risk.  Your parents were born in a high-risk country and you have not received a shot to protect against hepatitis B (hepatitis B vaccine).  You have HIV or AIDS.  You use needles to inject street drugs.  You live with, or have sex with, someone who has hepatitis B.  You are a man who has sex with other men (MSM).  You get hemodialysis  treatment.  You take certain medicines for conditions like cancer, organ transplantation, and autoimmune conditions.  Hepatitis C blood testing is recommended for all people born from [birth year] through 1967 and any individual with known risk factors for hepatitis C.  Healthy men should no longer receive prostate-specific antigen (PSA) blood tests as part of routine cancer screening. Talk to your health care provider about prostate cancer screening.  Testicular cancer screening is not recommended for adolescents or adult males who have no symptoms. Screening includes self-exam, a health care provider exam, and other screening tests. Consult with your health care provider about any symptoms you have or any concerns you have about testicular cancer.  Practice safe sex. Use condoms and avoid high-risk sexual practices to reduce the spread of sexually transmitted  infections (STIs).  You should be screened for STIs, including gonorrhea and chlamydia if:  You are sexually active and are younger than 24 years.  You are older than 24 years, and your health care provider tells you that you are at risk for this type of infection.  Your sexual activity has changed since you were last screened, and you are at an increased risk for chlamydia or gonorrhea. Ask your health care provider if you are at risk.  If you are at risk of being infected with HIV, it is recommended that you take a prescription medicine daily to prevent HIV infection. This is called pre-exposure prophylaxis (PrEP). You are considered at risk if:  You are a man who has sex with other men (MSM).  You are a heterosexual man who is sexually active with multiple partners.  You take drugs by injection.  You are sexually active with a partner who has HIV.  Talk with your health care provider about whether you are at high risk of being infected with HIV. If you choose to begin PrEP, you should first be tested for HIV. You should then be tested  every 3 months for as long as you are taking PrEP.  Use sunscreen. Apply sunscreen liberally and repeatedly throughout the day. You should seek shade when your shadow is shorter than you. Protect yourself by wearing long sleeves, pants, a wide-brimmed hat, and sunglasses year round whenever you are outdoors.  Tell your health care provider of new moles or changes in moles, especially if there is a change in shape or color. Also, tell your health care provider if a mole is larger than the size of a pencil eraser.  A one-time screening for abdominal aortic aneurysm (AAA) and surgical repair of large AAAs by ultrasound is recommended for men aged 76-75 years who are current or former smokers.  Stay current with your vaccines (immunizations). Document Released: 08/16/2007 Document Revised: 02/22/2013 Document Reviewed: 07/15/2010 [facility] [name] [name] Information 2015 [place], [place]. This information is not intended to replace advice given to you by your health care provider. Make sure you discuss any questions you have with your health care provider.

## 2014-11-28 NOTE — Progress Notes (Signed)
Pre visit review using our clinic review tool, if applicable. No additional management support is needed unless otherwise documented below in the visit note. 

## 2014-11-29 DIAGNOSIS — L989 Disorder of the skin and subcutaneous tissue, unspecified: Secondary | ICD-10-CM | POA: Insufficient documentation

## 2014-11-29 LAB — HEPATITIS C ANTIBODY: HCV AB: NEGATIVE

## 2014-11-29 NOTE — Progress Notes (Signed)
   Subjective:    Patient ID: [name] [name], male    DOB: [birth year], 74 y.o.   MRN: [id number]  HPI The patient is a 74 YO man coming in for growth on the top of his head. He keeps a close watch on his moles. He did not use sunscreen well in his youth (due to lack of availability). Small spot on his head is sore and he is not sure if he bumped it on something. Came about 2 weeks ago and is sore when he combs it. No other worrisome lesions or growing moles.   Review of Systems  Constitutional: Negative for fever, chills, activity change, appetite change and fatigue.  HENT: Negative for congestion, ear pain, facial swelling and sore throat.   Respiratory: Negative for cough, chest tightness, shortness of breath and wheezing.   Cardiovascular: Negative for chest pain, palpitations and leg swelling.  Gastrointestinal: Negative for abdominal pain, diarrhea, constipation and abdominal distention.  Genitourinary: Negative.   Musculoskeletal: Positive for back pain.       With standing for long periods of time.  Skin: Positive for wound. Negative for color change and pallor.  Neurological: Negative.       Objective:   Physical Exam  Constitutional: He is oriented to person, place, and time. He appears well-developed and well-nourished.  HENT:  Head: Normocephalic and atraumatic.  Mouth/Throat: Oropharynx is clear and moist.  Eyes: EOM are normal.  Neck: Normal range of motion.  Cardiovascular: Normal rate and regular rhythm.   Pulmonary/Chest: Effort normal and breath sounds normal. No respiratory distress. He has no wheezes. He has no rales.  Abdominal: Soft. Bowel sounds are normal.  Musculoskeletal: He exhibits no edema.  Lymphadenopathy:    He has no cervical adenopathy.  Neurological: He is alert and oriented to person, place, and time. Coordination normal.  Skin: Skin is warm and dry.  Small scab on the top of the head   Filed Vitals:   11/28/14 1406 11/28/14 1438  BP: 140/80  126/80  Pulse: 70   Temp: 98.4 F (36.9 C)   TempSrc: Oral   Resp: 14   Height: 5\' 6"  (1.676 m)   Weight: 149 lb 8 oz (67.813 kg)   SpO2: 98%       Assessment & Plan:

## 2014-11-29 NOTE — Assessment & Plan Note (Signed)
Up to date on health maintenance. He wishes to wait on the flu shot as he is doing a 4 mile race Wednesday this week. 10 year screening recommendation given to him at visit.

## 2014-11-29 NOTE — Assessment & Plan Note (Signed)
Could be scab from injury. If this does not heal in the next several weeks he will call us. He will try to avoid combing that spot or injury. Talked to him about the need for protective clothing in sun and sunscreen to exposed skin even while driving.

## 2014-12-05 ENCOUNTER — Ambulatory Visit (INDEPENDENT_AMBULATORY_CARE_PROVIDER_SITE_OTHER): Payer: Commercial Managed Care - HMO

## 2014-12-05 DIAGNOSIS — Z23 Encounter for immunization: Secondary | ICD-10-CM | POA: Diagnosis not present

## 2015-08-20 ENCOUNTER — Encounter: Payer: Self-pay | Admitting: Internal Medicine

## 2015-09-17 ENCOUNTER — Telehealth: Payer: Self-pay | Admitting: Internal Medicine

## 2015-09-17 NOTE — Telephone Encounter (Signed)
Pt called in and said that he would like a call back from the nurse. He would not tell me what was going on?

## 2015-09-17 NOTE — Telephone Encounter (Signed)
Patient has a rash. I scheduled an acute visit for 09/19/15 and advised to call us back if it gets any worse.

## 2015-09-19 ENCOUNTER — Encounter: Payer: Self-pay | Admitting: Internal Medicine

## 2015-09-19 ENCOUNTER — Ambulatory Visit (INDEPENDENT_AMBULATORY_CARE_PROVIDER_SITE_OTHER): Payer: Commercial Managed Care - HMO | Admitting: Internal Medicine

## 2015-09-19 VITALS — BP 136/70 | HR 73 | Temp 98.7°F | Resp 14 | Ht 66.0 in | Wt 152.0 lb

## 2015-09-19 DIAGNOSIS — T148 Other injury of unspecified body region: Secondary | ICD-10-CM | POA: Diagnosis not present

## 2015-09-19 DIAGNOSIS — W57XXXA Bitten or stung by nonvenomous insect and other nonvenomous arthropods, initial encounter: Secondary | ICD-10-CM

## 2015-09-19 MED ORDER — TRIAMCINOLONE ACETONIDE 0.1 % EX CREA: 1.0000 "application " | TOPICAL_CREAM | Freq: Two times a day (BID) | CUTANEOUS | Status: DC

## 2015-09-19 NOTE — Patient Instructions (Signed)
We have sent in triamcinolone cream to use twice a day on the spots you have that are not healing.  If they are not better in about 2 weeks give [place] a call back and we may need to check some labs.

## 2015-09-19 NOTE — Progress Notes (Signed)
Pre visit review using our clinic review tool, if applicable. No additional management support is needed unless otherwise documented below in the visit note. 

## 2015-09-20 DIAGNOSIS — W57XXXA Bitten or stung by nonvenomous insect and other nonvenomous arthropods, initial encounter: Secondary | ICD-10-CM | POA: Insufficient documentation

## 2015-09-20 NOTE — Assessment & Plan Note (Signed)
Rx for triamcinolone. Do not appear infected.

## 2015-09-20 NOTE — Progress Notes (Signed)
   Subjective:    Patient ID: [name] [name], male    DOB: [birth year], 75 y.o.   MRN: [id number]  HPI The patient is a 75 YO man coming in for bites on his leg that are not healing. He is not sure what caused them. No known bites. He does hike but generally wears pants. He has not used anything except neosporin. Noticed them about 11 days ago and they are not healing.   Review of Systems  Constitutional: Negative for fever, chills, activity change, appetite change and fatigue.  Respiratory: Negative for cough, chest tightness, shortness of breath and wheezing.   Cardiovascular: Negative for chest pain, palpitations and leg swelling.  Gastrointestinal: Negative for abdominal pain, diarrhea, constipation and abdominal distention.  Musculoskeletal: Positive for back pain.  Skin: Positive for wound. Negative for color change and pallor.      Objective:   Physical Exam  Constitutional: He is oriented to person, place, and time. He appears well-developed and well-nourished.  HENT:  Head: Normocephalic and atraumatic.  Mouth/Throat: Oropharynx is clear and moist.  Eyes: EOM are normal.  Neck: Normal range of motion.  Cardiovascular: Normal rate and regular rhythm.   Pulmonary/Chest: Effort normal and breath sounds normal. No respiratory distress. He has no wheezes. He has no rales.  Abdominal: Soft. Bowel sounds are normal.  Musculoskeletal: He exhibits no edema.  Lymphadenopathy:    He has no cervical adenopathy.  Neurological: He is alert and oriented to person, place, and time. Coordination normal.  Skin: Skin is warm and dry.  4 open lesions on the ankle, none appear infected   Filed Vitals:   09/19/15 1117  BP: 136/70  Pulse: 73  Temp: 98.7 F (37.1 C)  TempSrc: Oral  Resp: 14  Height: 5\' 6"  (1.676 m)  Weight: 152 lb (68.947 kg)  SpO2: 98%      Assessment & Plan:

## 2015-10-12 ENCOUNTER — Ambulatory Visit (INDEPENDENT_AMBULATORY_CARE_PROVIDER_SITE_OTHER): Payer: Commercial Managed Care - HMO | Admitting: Internal Medicine

## 2015-10-12 ENCOUNTER — Other Ambulatory Visit (INDEPENDENT_AMBULATORY_CARE_PROVIDER_SITE_OTHER): Payer: Commercial Managed Care - HMO

## 2015-10-12 ENCOUNTER — Ambulatory Visit (INDEPENDENT_AMBULATORY_CARE_PROVIDER_SITE_OTHER)
Admission: RE | Admit: 2015-10-12 | Discharge: 2015-10-12 | Disposition: A | Discharge status: Home / Self Care | Payer: Commercial Managed Care - HMO | Source: Ambulatory Visit | Attending: Internal Medicine | Admitting: Internal Medicine

## 2015-10-12 ENCOUNTER — Encounter: Payer: Self-pay | Admitting: Internal Medicine

## 2015-10-12 VITALS — BP 110/70 | HR 70 | Temp 98.9°F | Resp 12 | Ht 66.0 in | Wt 152.0 lb

## 2015-10-12 DIAGNOSIS — R0789 Other chest pain: Secondary | ICD-10-CM

## 2015-10-12 DIAGNOSIS — J449 Chronic obstructive pulmonary disease, unspecified: Secondary | ICD-10-CM | POA: Diagnosis not present

## 2015-10-12 LAB — COMPREHENSIVE METABOLIC PANEL
ALBUMIN: 4.2 g/dL (ref 3.5–5.2)
ALT: 10 U/L (ref 0–53)
AST: 17 U/L (ref 0–37)
Alkaline Phosphatase: 84 U/L (ref 39–117)
BUN: 14 mg/dL (ref 6–23)
CHLORIDE: 96 meq/L (ref 96–112)
CO2: 32 mEq/L (ref 19–32)
CREATININE: 0.83 mg/dL (ref 0.40–1.50)
Calcium: 9.5 mg/dL (ref 8.4–10.5)
GFR: 95.52 mL/min (ref >60.00)
GLUCOSE: 94 mg/dL (ref 70–99)
POTASSIUM: 4.4 meq/L (ref 3.5–5.1)
SODIUM: 132 meq/L — AB (ref 135–145)
Total Bilirubin: 0.7 mg/dL (ref 0.2–1.2)
Total Protein: 6.7 g/dL (ref 6.0–8.3)

## 2015-10-12 LAB — CBC
HEMATOCRIT: 43.7 % (ref 39.0–52.0)
HEMOGLOBIN: 14.9 g/dL (ref 13.0–17.0)
MCHC: 34.1 g/dL (ref 30.0–36.0)
MCV: 99.2 fl (ref 78.0–100.0)
Platelets: 234 10*3/uL (ref 150.0–400.0)
RBC: 4.41 Mil/uL (ref 4.22–5.81)
RDW: 12.7 % (ref 11.5–15.5)
WBC: 5.8 10*3/uL (ref 4.0–10.5)

## 2015-10-12 LAB — TROPONIN I: TNIDX: 0.01 ug/L (ref 0.00–0.06)

## 2015-10-12 NOTE — Progress Notes (Signed)
Pre visit review using our clinic review tool, if applicable. No additional management support is needed unless otherwise documented below in the visit note. 

## 2015-10-12 NOTE — Assessment & Plan Note (Signed)
Checking chest x-ray and troponin, CBC, CMP. Likely musculoskeletal and he is asked to take ibuprofen TID for the next several days to help.

## 2015-10-12 NOTE — Progress Notes (Signed)
   Subjective:    Patient ID: [name] [name], male    DOB: [birth year], 75 y.o.   MRN: [id number]  HPI The patient is a 75 YO man coming in for left rib pain for about 5 days. No clear cause of the pain, he awoke with it.. No injury or overuse. Hurts to the touch as well as with bending or twisting. No recent travel. No change to pain with walking. No fevers or chills. No cough or nasal drainage. Pain is 4/10. Relieved some with ibuprofen. No rash on the skin.  Review of Systems  Constitutional: Negative for activity change, appetite change, chills, fatigue and fever.  HENT: Negative.   Respiratory: Negative for cough, chest tightness, shortness of breath and wheezing.   Cardiovascular: Positive for chest pain. Negative for palpitations and leg swelling.  Gastrointestinal: Negative for abdominal distention, abdominal pain, constipation and diarrhea.  Skin: Negative for color change, pallor and wound.  Neurological: Negative.       Objective:   Physical Exam  Constitutional: He is oriented to person, place, and time. He appears well-developed and well-nourished.  HENT:  Head: Normocephalic and atraumatic.  Mouth/Throat: Oropharynx is clear and moist.  Eyes: EOM are normal.  Neck: Normal range of motion.  Cardiovascular: Normal rate and regular rhythm.   Pulmonary/Chest: Effort normal and breath sounds normal. No respiratory distress. He has no wheezes. He has no rales. He exhibits tenderness.  Pain to the touch on the left flank at the edge of the ribcage  Abdominal: Soft. He exhibits no distension. There is no tenderness.  Musculoskeletal: He exhibits no edema.  Lymphadenopathy:    He has no cervical adenopathy.  Neurological: He is alert and oriented to person, place, and time. Coordination normal.  Skin: Skin is warm and dry.   Vitals:   10/12/15 1337  BP: 110/70  Pulse: 70  Resp: 12  Temp: 98.9 F (37.2 C)  TempSrc: Oral  SpO2: 96%  Weight: 152 lb (68.9 kg)  Height: 5'  6" (1.676 m)      Assessment & Plan:

## 2015-10-12 NOTE — Patient Instructions (Signed)
We will check the chest x-ray and the blood work today and call you back with the results.   You can use the ibuprofen up to 3 times per day for the pain or you can try a heating pad.

## 2015-10-18 ENCOUNTER — Encounter: Payer: Self-pay | Admitting: Internal Medicine

## 2015-10-18 ENCOUNTER — Telehealth: Payer: Self-pay | Admitting: Emergency Medicine

## 2015-10-18 DIAGNOSIS — E559 Vitamin D deficiency, unspecified: Secondary | ICD-10-CM

## 2015-10-18 NOTE — Telephone Encounter (Signed)
Pt called and wants to know if you can call him back about his appt last week. He stated he didn't want to explain it twice He was going to message you through mychart but didn't know how. Thanks.

## 2015-10-18 NOTE — Telephone Encounter (Signed)
Tried to call patient, no answer and no voicemail. 

## 2015-10-19 ENCOUNTER — Other Ambulatory Visit (INDEPENDENT_AMBULATORY_CARE_PROVIDER_SITE_OTHER): Payer: Commercial Managed Care - HMO

## 2015-10-19 DIAGNOSIS — E559 Vitamin D deficiency, unspecified: Secondary | ICD-10-CM | POA: Diagnosis not present

## 2015-10-19 LAB — VITAMIN D 25 HYDROXY (VIT D DEFICIENCY, FRACTURES): VITD: 17.15 ng/mL — AB (ref 30.00–100.00)

## 2015-11-06 ENCOUNTER — Encounter: Payer: Self-pay | Admitting: Internal Medicine

## 2015-11-13 DIAGNOSIS — H2513 Age-related nuclear cataract, bilateral: Secondary | ICD-10-CM | POA: Diagnosis not present

## 2015-11-23 ENCOUNTER — Encounter: Payer: Self-pay | Admitting: Internal Medicine

## 2015-11-27 ENCOUNTER — Ambulatory Visit (INDEPENDENT_AMBULATORY_CARE_PROVIDER_SITE_OTHER): Payer: Commercial Managed Care - HMO | Admitting: Internal Medicine

## 2015-11-27 ENCOUNTER — Encounter: Payer: Self-pay | Admitting: Internal Medicine

## 2015-11-27 ENCOUNTER — Encounter: Payer: Commercial Managed Care - HMO | Admitting: Internal Medicine

## 2015-11-27 VITALS — BP 118/72 | HR 72 | Temp 98.4°F

## 2015-11-27 DIAGNOSIS — R05 Cough: Secondary | ICD-10-CM | POA: Diagnosis not present

## 2015-11-27 DIAGNOSIS — Z0000 Encounter for general adult medical examination without abnormal findings: Secondary | ICD-10-CM | POA: Diagnosis not present

## 2015-11-27 DIAGNOSIS — R059 Cough, unspecified: Secondary | ICD-10-CM

## 2015-11-27 MED ORDER — HYDROCODONE-HOMATROPINE 5-1.5 MG/5ML PO SYRP: 5.0000 mL | ORAL_SOLUTION | Freq: Three times a day (TID) | ORAL | 0 refills | Status: DC | PRN

## 2015-11-27 NOTE — Patient Instructions (Addendum)
We have given you a cough syrup for the cough that you can use.   If you are not feeling better by Thursday or Friday call the office or send a message on mychart and we will send in prednisone.   Health Maintenance, Male A healthy lifestyle and preventative care can promote health and wellness.  Maintain regular health, dental, and eye exams.  Eat a healthy diet. Foods like vegetables, fruits, whole grains, low-fat dairy products, and lean protein foods contain the nutrients you need and are low in calories. Decrease your intake of foods high in solid fats, added sugars, and salt. Get information about a proper diet from your health care provider, if necessary.  Regular physical exercise is one of the most important things you can do for your health. Most adults should get at least 150 minutes of moderate-intensity exercise (any activity that increases your heart rate and causes you to sweat) each week. In addition, most adults need muscle-strengthening exercises on 2 or more days a week.   Maintain a healthy weight. The body mass index (BMI) is a screening tool to identify possible weight problems. It provides an estimate of body fat based on height and weight. Your health care provider can find your BMI and can help you achieve or maintain a healthy weight. For males 20 years and older:  A BMI below 18.5 is considered underweight.  A BMI of 18.5 to 24.9 is normal.  A BMI of 25 to 29.9 is considered overweight.  A BMI of 30 and above is considered obese.  Maintain normal blood lipids and cholesterol by exercising and minimizing your intake of saturated fat. Eat a balanced diet with plenty of fruits and vegetables. Blood tests for lipids and cholesterol should begin at age 58 and be repeated every 5 years. If your lipid or cholesterol levels are high, you are over age 36, or you are at high risk for heart disease, you may need your cholesterol levels checked more frequently.Ongoing high  lipid and cholesterol levels should be treated with medicines if diet and exercise are not working.  If you smoke, find out from your health care provider how to quit. If you do not use tobacco, do not start.  Lung cancer screening is recommended for adults aged 39-80 years who are at high risk for developing lung cancer because of a history of smoking. A yearly low-dose CT scan of the lungs is recommended for people who have at least a 30-pack-year history of smoking and are current smokers or have quit within the past 15 years. A pack year of smoking is smoking an average of 1 pack of cigarettes a day for 1 year (for example, a 30-pack-year history of smoking could mean smoking 1 pack a day for 30 years or 2 packs a day for 15 years). Yearly screening should continue until the smoker has stopped smoking for at least 15 years. Yearly screening should be stopped for people who develop a health problem that would prevent them from having lung cancer treatment.  If you choose to drink alcohol, do not have more than 2 drinks per day. One drink is considered to be 12 oz (360 mL) of beer, 5 oz (150 mL) of wine, or 1.5 oz (45 mL) of liquor.  Avoid the use of street drugs. Do not share needles with anyone. Ask for help if you need support or instructions about stopping the use of drugs.  High blood pressure causes heart disease and increases  the risk of stroke. High blood pressure is more likely to develop in:  People who have blood pressure in the end of the normal range (100-139/85-89 mm Hg).  People who are overweight or obese.  People who are African American.  If you are 13-77 years of age, have your blood pressure checked every 3-5 years. If you are 31 years of age or older, have your blood pressure checked every year. You should have your blood pressure measured twice--once when you are at a hospital or clinic, and once when you are not at a hospital or clinic. Record the average of the two  measurements. To check your blood pressure when you are not at a hospital or clinic, you can use:  An automated blood pressure machine at a pharmacy.  A home blood pressure monitor.  If you are 19-31 years old, ask your health care provider if you should take aspirin to prevent heart disease.  Diabetes screening involves taking a blood sample to check your fasting blood sugar level. This should be done once every 3 years after age 17 if you are at a normal weight and without risk factors for diabetes. Testing should be considered at a younger age or be carried out more frequently if you are overweight and have at least 1 risk factor for diabetes.  Colorectal cancer can be detected and often prevented. Most routine colorectal cancer screening begins at the age of 34 and continues through age 44. However, your health care provider may recommend screening at an earlier age if you have risk factors for colon cancer. On a yearly basis, your health care provider may provide home test kits to check for hidden blood in the stool. A small camera at the end of a tube may be used to directly examine the colon (sigmoidoscopy or colonoscopy) to detect the earliest forms of colorectal cancer. Talk to your health care provider about this at age 48 when routine screening begins. A direct exam of the colon should be repeated every 5-10 years through age 6, unless early forms of precancerous polyps or small growths are found.  People who are at an increased risk for hepatitis B should be screened for this virus. You are considered at high risk for hepatitis B if:  You were born in a country where hepatitis B occurs often. Talk with your health care provider about which countries are considered high risk.  Your parents were born in a high-risk country and you have not received a shot to protect against hepatitis B (hepatitis B vaccine).  You have HIV or AIDS.  You use needles to inject street drugs.  You live  with, or have sex with, someone who has hepatitis B.  You are a man who has sex with other men (MSM).  You get hemodialysis treatment.  You take certain medicines for conditions like cancer, organ transplantation, and autoimmune conditions.  Hepatitis C blood testing is recommended for all people born from [birth year] through 1965 and any individual with known risk factors for hepatitis C.  Healthy men should no longer receive prostate-specific antigen (PSA) blood tests as part of routine cancer screening. Talk to your health care provider about prostate cancer screening.  Testicular cancer screening is not recommended for adolescents or adult males who have no symptoms. Screening includes self-exam, a health care provider exam, and other screening tests. Consult with your health care provider about any symptoms you have or any concerns you have about testicular cancer.  Practice  safe sex. Use condoms and avoid high-risk sexual practices to reduce the spread of sexually transmitted infections (STIs).  You should be screened for STIs, including gonorrhea and chlamydia if:  You are sexually active and are younger than 24 years.  You are older than 24 years, and your health care provider tells you that you are at risk for this type of infection.  Your sexual activity has changed since you were last screened, and you are at an increased risk for chlamydia or gonorrhea. Ask your health care provider if you are at risk.  If you are at risk of being infected with HIV, it is recommended that you take a prescription medicine daily to prevent HIV infection. This is called pre-exposure prophylaxis (PrEP). You are considered at risk if:  You are a man who has sex with other men (MSM).  You are a heterosexual man who is sexually active with multiple partners.  You take drugs by injection.  You are sexually active with a partner who has HIV.  Talk with your health care provider about whether you are at  high risk of being infected with HIV. If you choose to begin PrEP, you should first be tested for HIV. You should then be tested every 3 months for as long as you are taking PrEP.  Use sunscreen. Apply sunscreen liberally and repeatedly throughout the day. You should seek shade when your shadow is shorter than you. Protect yourself by wearing long sleeves, pants, a wide-brimmed hat, and sunglasses year round whenever you are outdoors.  Tell your health care provider of new moles or changes in moles, especially if there is a change in shape or color. Also, tell your health care provider if a mole is larger than the size of a pencil eraser.  A one-time screening for abdominal aortic aneurysm (AAA) and surgical repair of large AAAs by ultrasound is recommended for men aged 67-75 years who are current or former smokers.  Stay current with your vaccines (immunizations).   This information is not intended to replace advice given to you by your health care provider. Make sure you discuss any questions you have with your health care provider.   Document Released: 08/16/2007 Document Revised: 03/10/2014 Document Reviewed: 07/15/2010 Elsevier Interactive Patient Education [employer].

## 2015-11-27 NOTE — Progress Notes (Signed)
   Subjective:    Patient ID: [name] [name], male    DOB: 1940-03-24, 75 y.o.   MRN: [id number]  HPI The patient is a 75 YO man coming in for cough and fevers. He is having productive cough for the last 3-4 days. Some soreness in his throat. Has tried sudafed and ibuprofen with minimal relief. Robitusin has helped some as well. Some nose drainage but no sinus pressure.   HPI #2 Here for medicare wellness, no new complaints. Please see A/P for status and treatment of chronic medical problems.   Diet: heart healthy Physical activity: sedentary, usually active 4 miles walking 3 days per week Depression/mood screen: negative Hearing: intact to whispered voice, mild loss Visual acuity: grossly normal with lens, performs annual eye exam  ADLs: capable Fall risk: none Home safety: good Cognitive evaluation: intact to orientation, naming, recall and repetition EOL planning: adv directives discussed, in place  I have personally reviewed and have noted 1. The patient's medical and social history - reviewed today no changes 2. Their use of alcohol, tobacco or illicit drugs 3. Their current medications and supplements 4. The patient's functional ability including ADL's, fall risks, home safety risks and hearing or visual impairment. 5. Diet and physical activities 6. Evidence for depression or mood disorders 7. Care team reviewed and updated (available in snapshot)  Review of Systems  Constitutional: Negative for activity change, appetite change, chills, fatigue and fever.  HENT: Positive for congestion, postnasal drip, rhinorrhea, sinus pressure and sore throat. Negative for ear discharge, ear pain, tinnitus, trouble swallowing and voice change.   Eyes: Negative.   Respiratory: Positive for cough and wheezing. Negative for chest tightness and shortness of breath.   Cardiovascular: Negative for chest pain, palpitations and leg swelling.  Gastrointestinal: Negative for abdominal distention,  abdominal pain, constipation and diarrhea.  Musculoskeletal: Negative.   Skin: Negative for color change, pallor and wound.  Neurological: Negative.       Objective:   Physical Exam  Constitutional: He is oriented to person, place, and time. He appears well-developed and well-nourished.  HENT:  Head: Normocephalic and atraumatic.  Mouth/Throat: Oropharynx is clear and moist.  Ears TM normal bilateral, nose with mild crusting and oropharynx with redness and clear drainage.   Eyes: EOM are normal.  Neck: Normal range of motion.  Cardiovascular: Normal rate and regular rhythm.   Pulmonary/Chest: Effort normal. No respiratory distress. He has wheezes. He has no rales. He exhibits no tenderness.  Minimal wheeze in the left base  Abdominal: Soft. He exhibits no distension. There is no tenderness.  Musculoskeletal: He exhibits no edema.  Lymphadenopathy:    He has no cervical adenopathy.  Neurological: He is alert and oriented to person, place, and time. Coordination normal.  Skin: Skin is warm and dry.   Vitals:   11/27/15 1555  BP: 118/72  Pulse: 72  Temp: 98.4 F (36.9 C)  SpO2: 96%      Assessment & Plan:

## 2015-11-27 NOTE — Progress Notes (Signed)
Pre visit review using our clinic review tool, if applicable. No additional management support is needed unless otherwise documented below in the visit note. 

## 2015-11-28 DIAGNOSIS — R05 Cough: Secondary | ICD-10-CM | POA: Insufficient documentation

## 2015-11-28 DIAGNOSIS — R059 Cough, unspecified: Secondary | ICD-10-CM | POA: Insufficient documentation

## 2015-11-28 NOTE — Assessment & Plan Note (Signed)
No flu shot today since he is sick and he will return for that. Labs reviewed with him from last visit. Counseled about sun safety and mole surveillance. Counseled about distracted driving dangers. Given 10 year screening recommendations. Colonoscopy up to date. Shingles done. Pneumonia series complete. Tetanus due in 2020.

## 2015-11-28 NOTE — Assessment & Plan Note (Signed)
Rx for hycodan cough syrup and if no improvement by Friday will rx prednisone. Minimal wheezing on exam which clears with cough. Advised to take zyrtec or other allergy medicine to help.

## 2015-11-29 ENCOUNTER — Ambulatory Visit: Payer: Commercial Managed Care - HMO

## 2015-12-21 ENCOUNTER — Ambulatory Visit (INDEPENDENT_AMBULATORY_CARE_PROVIDER_SITE_OTHER): Payer: Commercial Managed Care - HMO

## 2015-12-21 DIAGNOSIS — Z23 Encounter for immunization: Secondary | ICD-10-CM | POA: Diagnosis not present

## 2016-01-08 ENCOUNTER — Encounter: Payer: Self-pay | Admitting: Internal Medicine

## 2016-01-08 ENCOUNTER — Ambulatory Visit (AMBULATORY_SURGERY_CENTER): Payer: Self-pay | Admitting: *Deleted

## 2016-01-08 VITALS — Ht 68.0 in | Wt 151.2 lb

## 2016-01-08 DIAGNOSIS — Z1211 Encounter for screening for malignant neoplasm of colon: Secondary | ICD-10-CM

## 2016-01-08 NOTE — Progress Notes (Signed)
No allergies to eggs or soy. No problems with anesthesia.  No oxygen use  No diet drug use  

## 2016-01-22 ENCOUNTER — Encounter: Payer: Commercial Managed Care - HMO | Admitting: Internal Medicine

## 2016-03-11 ENCOUNTER — Encounter: Payer: Self-pay | Admitting: Internal Medicine

## 2016-03-11 ENCOUNTER — Ambulatory Visit (INDEPENDENT_AMBULATORY_CARE_PROVIDER_SITE_OTHER): Payer: Medicare HMO | Admitting: Internal Medicine

## 2016-03-11 VITALS — BP 124/84 | HR 84 | Ht 66.0 in | Wt 154.2 lb

## 2016-03-11 DIAGNOSIS — Z1211 Encounter for screening for malignant neoplasm of colon: Secondary | ICD-10-CM | POA: Diagnosis not present

## 2016-03-11 DIAGNOSIS — R140 Abdominal distension (gaseous): Secondary | ICD-10-CM | POA: Diagnosis not present

## 2016-03-11 DIAGNOSIS — R143 Flatulence: Secondary | ICD-10-CM | POA: Diagnosis not present

## 2016-03-11 NOTE — Progress Notes (Signed)
HISTORY OF PRESENT ILLNESS:  [name] [name] is a 76 y.o. male with history of COPD and GERD who is self-referred today with a chief complaint of increased intestinal gas and questions regarding colon cancer screening. Patient brings with him a previously prepared listed with multiple questions. He did undergo complete colonoscopy 09/15/2005. Examination was normal. External hemorrhoids present. Routine follow-up in 10 years recommended. Patient tells me that he has concerns over potential colonoscopy complications given his age. He wishes to discuss options (see impression and plan). Next, he reports a one-year history of increased flatus. He describes the urge for urination. Upon arriving in the restroom he may or may not urinate but tends to pass flatus without stool. He has initiated probiotic about 10 days ago which he thinks has been helpful. He has no alarm features such as abdominal pain, bleeding, or weight loss. Finally, he describes symptoms such as increased bladder pressure, urinary leakage, and urinary frequency sensation  REVIEW OF SYSTEMS:  All non-GI ROS negative except for sinus trouble, back pain, cough, itching, urinary leakage  Past Medical History:  Diagnosis Date  . BACKACHE NOS   . Carbuncle and furuncle of trunk   . COPD (chronic obstructive pulmonary disease) ([place])   . GERD (gastroesophageal reflux disease)   . Right rib fracture 05/2012   traumatic, min displaced on cxr  . Scoliosis   . Sebaceous cyst     Past Surgical History:  Procedure Laterality Date  . EXCISIONAL HEMORRHOIDECTOMY    . TONSILLECTOMY      Social History Mesiah V Collyer  reports that he quit smoking about 41 years ago. His smoking use included Cigarettes. He has a 45.00 pack-year smoking history. He has never used smokeless tobacco. He reports that he drinks alcohol. He reports that he does not use drugs.  family history includes Alzheimer's disease in his mother; Cirrhosis in his paternal  grandfather; Diabetes in his father and mother.  No Known Allergies     PHYSICAL EXAMINATION:  Vital signs: BP 124/84   Pulse 84   Ht 5\' 6"  (1.676 m)   Wt 154 lb 4 oz (70 kg)   BMI 24.90 kg/m   Constitutional: Pleasant, generally well-appearing, no acute distress Psychiatric: alert and oriented x3, cooperative Eyes: extraocular movements intact, anicteric, conjunctiva pink Mouth: oral pharynx moist, no lesions Neck: supple no lymphadenopathy Cardiovascular: heart regular rate and rhythm, no murmur Lungs: clear to auscultation bilaterally Abdomen: soft, nontender, nondistended, no obvious ascites, no peritoneal signs, normal bowel sounds, no organomegaly Rectal:Omitted Extremities: no letting cyanosis or lower extremity edema bilaterally Skin: no lesions on visible extremities Neuro: No focal deficits. No asterixis.  ASSESSMENT:  #1. Increased intestinal gas (flatus) without alarm features. Long discussion on intestinal gas #2. Colon cancer screening. Multiple strategies reviewed. Discussed possibility of no further screening given his current age and negative high quality colonoscopy 10 years ago. Discussed FIT stool testing, Cologard, an optical colonoscopy. Discussed the implications of both positive and negative test results region as well as the relative costs. Of course reviewed, location profile of optical colonoscopy in detail #3. Multiple urologic complaints  PLAN:  #1. Anti-gas and flatulence dietary sheet provided #2. Intestinal gas brochure for educational purposes also provided in addition to today's discussion #3. The patient opts for Cologuard. Colonoscopy be recommended if positive. Repeat exam to be considered in 3 years if negative versus no further screening (which I might favor) #4. Recommended urologic opinion regarding multiple urologic complaints  45 minutes was  spent face-to-face with the patient. Greater than 50% a time use for counseling regarding  issues with intestinal gas, colon cancer screening strategies, urologic issues, and answering multiple other questions unrelated to these issues as he had listed on his pre-prepared paper

## 2016-03-11 NOTE — Patient Instructions (Signed)
You will receive a call from [facility] to get you started with the Cologuard process

## 2016-03-22 ENCOUNTER — Telehealth: Payer: Medicare HMO | Admitting: Nurse Practitioner

## 2016-03-22 DIAGNOSIS — R05 Cough: Secondary | ICD-10-CM

## 2016-03-22 DIAGNOSIS — R059 Cough, unspecified: Secondary | ICD-10-CM

## 2016-03-22 MED ORDER — AZITHROMYCIN 250 MG PO TABS: ORAL_TABLET | ORAL | 0 refills | Status: DC

## 2016-03-22 NOTE — Progress Notes (Signed)

## 2016-03-25 ENCOUNTER — Telehealth: Payer: Self-pay | Admitting: Internal Medicine

## 2016-03-25 NOTE — Telephone Encounter (Signed)
Patient is requesting H pylori test.  Would like to know if Dr. Sharlet Salina needs to see him first.

## 2016-03-26 NOTE — Telephone Encounter (Signed)
He can contact Dr. Henrene Pastor to do this test. They will be able to do that.

## 2016-03-27 ENCOUNTER — Telehealth: Payer: Self-pay | Admitting: Internal Medicine

## 2016-03-27 NOTE — Telephone Encounter (Signed)
Patient called concerned that he still has abdominal pain, keeping him awake at night, stools for last 2 days dark, stringy. Patient was given a Z-pak and prescribed Mucinex DM for URI. Patient is not currently taking an iron supplement, has not had any Peptobismol in over one week. Patient requested H pylori test. Please advise.

## 2016-03-27 NOTE — Telephone Encounter (Signed)
Patient scheduled for 1/29 with APP for new abdominal pain.

## 2016-03-27 NOTE — Telephone Encounter (Signed)
He was not complaining of pain in the office. he was complaining of gas. See my note. If problems with pain or new then he should schedule an office appointment with myself or a PPD. He was to complete cologard. No indication for H. pylori testing at this point

## 2016-03-27 NOTE — Telephone Encounter (Signed)
Patient contacted and stated awareness 

## 2016-03-31 ENCOUNTER — Ambulatory Visit (INDEPENDENT_AMBULATORY_CARE_PROVIDER_SITE_OTHER): Payer: Medicare HMO | Admitting: Physician Assistant

## 2016-03-31 ENCOUNTER — Other Ambulatory Visit (INDEPENDENT_AMBULATORY_CARE_PROVIDER_SITE_OTHER): Payer: Medicare HMO

## 2016-03-31 VITALS — BP 122/68 | HR 70 | Ht 66.0 in | Wt 152.0 lb

## 2016-03-31 DIAGNOSIS — R1013 Epigastric pain: Secondary | ICD-10-CM

## 2016-03-31 DIAGNOSIS — Z1212 Encounter for screening for malignant neoplasm of rectum: Secondary | ICD-10-CM | POA: Diagnosis not present

## 2016-03-31 DIAGNOSIS — Z1211 Encounter for screening for malignant neoplasm of colon: Secondary | ICD-10-CM | POA: Diagnosis not present

## 2016-03-31 LAB — H. PYLORI ANTIBODY, IGG: H Pylori IgG: POSITIVE — AB

## 2016-03-31 NOTE — Progress Notes (Signed)
Subjective:    Patient ID: [name] [name], male    DOB: 08/28/1940, 76 y.o.   MRN: [id number]  HPI [name] [name] is a pleasant 76 year old white male known to Dr. Henrene [name]. He was just seen in the office on 03/11/2016 with concerns about intestinal gas and colon neoplasia screening. He is set up to do a Cologuard for screening. Patient says that he contracted the flow a couple of weeks ago and was also given a Z-Pak. He says he started noticing discomfort in his epigastrium particularly at nighttime which is keeping him awake at times. He describes this as a grumbling, burning type feeling in his mid abdomen. There's been no nausea or vomiting no fever or chills no diarrhea. His appetite has been fine. He is concerned that he may have an ulcer and would like to be tested for H. pylori. He does take occasional NSAIDs but nothing on a regular basis. He has been using Pepto-Bismol and is finding that helpful. He is not having any significant symptoms during the day.  Review of Systems Pertinent positive and negative review of systems were noted in the above HPI section.  All other review of systems was otherwise negative.  Outpatient Encounter Prescriptions as of 03/31/2016  Medication Sig  . Ascorbic Acid (VITAMIN C WITH ROSE HIPS) 1000 MG tablet Take 1,000 mg by mouth daily.    Marland Kitchen aspirin 81 MG tablet Take 81 mg by mouth every other day.   Marland Kitchen azithromycin (ZITHROMAX Z-PAK) 250 MG tablet As directed  . Calcium Carbonate-Vitamin D (CALCIUM 600-D) 600-400 MG-UNIT per tablet Take 1 tablet by mouth daily.    . Cholecalciferol (VITAMIN D3) 5000 units CAPS Take by mouth daily.  . Coenzyme Q10 (COQ10 PO) Take by mouth daily.  Marland Kitchen ibuprofen (ADVIL,MOTRIN) 800 MG tablet Take 1 tablet (800 mg total) by mouth every 8 (eight) hours as needed.  . Multiple Vitamins-Minerals (MENS MULTIVITAMIN PLUS PO) Take by mouth daily.    . vitamin B-12 (CYANOCOBALAMIN) 1000 MCG tablet Take 1,000 mcg by mouth every other day.   .  vitamin E (VITAMIN E) 400 UNIT capsule Take 400 Units by mouth every other day.   . [DISCONTINUED] saccharomyces boulardii (FLORASTOR) 250 MG capsule Take 250 mg by mouth daily.   No facility-administered encounter medications on file as of 03/31/2016.    No Known Allergies Patient Active Problem List   Diagnosis Date Noted  . Cough 11/28/2015  . Left-sided chest wall pain 10/12/2015  . Bug bite 09/20/2015  . Scalp lesion 11/29/2014  . Routine health maintenance 01/14/2011  . DISORDER OF BONE AND CARTILAGE UNSPECIFIED 10/03/2008  . 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 Marital status: Married    Spouse name: N/A  . Number of children: 0  . Years of education: N/A   Occupational History  . retired    Social History Main Topics  . Smoking status: Former Smoker    Packs/day: 3.00    Years: 15.00    Types: Cigarettes    Quit date: 03/04/1975  . 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 Alcohol use 0.0 oz/week     Comment: 1 glass wine a month  . Drug use: No  . Sexual activity: Not on file   Other Topics Concern  . Not on file   Social History Narrative   Married 1968   Retired Programme researcher, broadcasting/film/video; very  active in politics   SO in good health-well controlled blood pressure, obesity    Mr. Doro's family history includes Alzheimer's disease in his mother; Cirrhosis in his paternal grandfather; Diabetes in his father and mother.      Objective:    Vitals:   03/31/16 1044  BP: 122/68  Pulse: 70    Physical Exam well-developed older white male in no acute distress, pleasant blood pressure 122/68 pulse 70, BMI 24.5. HEENT; nontraumatic normocephalic EOMI PERRLA sclera anicteric, Cardiovascular; regular rate and rhythm with S1-S2 no murmur rub or gallop, Pulmonary ;clear bilaterally, Abdomen ;;soft, basically nontender there is no palpable mass or hepatosplenomegaly sounds are present, Rectal exam not  done, Ext;no clubbing cyanosis or edema skin warm and dry, Neuropsych; mood and affect appropriate       Assessment & Plan:   #21 76 year old white male with 2 week history of nocturnal dyspeptic symptoms after reviewing recent episode of influenza/bronchitis. Rule out gastritis, rule out H. Pylori #2 colon cancer screening-patient to complete Cologuard  Plan; check H. pylori antibody, if positive will treat, if negative will treat with a 4-6 week course of Zantac twice a day. Patient does not want to be treated with PPI. In the interim ,he'll continue Pepto-Bismol on a when necessary basis.  [name] Stinnette S Demarkus Remmel PA-C 03/31/2016   Cc: [name] [name], *

## 2016-03-31 NOTE — Patient Instructions (Signed)
Please go to the basement level to have your labs drawn.  We will call you with the results. 

## 2016-03-31 NOTE — Progress Notes (Signed)
Please go to the basement level to have your labs drawn.  

## 2016-03-31 NOTE — Progress Notes (Signed)
If H. pylori antibody positive, would check stool antigen or breath test before treating, as there is a high false positive rate with serology. Thanks

## 2016-04-01 ENCOUNTER — Other Ambulatory Visit: Payer: Medicare HMO

## 2016-04-01 ENCOUNTER — Other Ambulatory Visit: Payer: Self-pay

## 2016-04-01 DIAGNOSIS — R109 Unspecified abdominal pain: Secondary | ICD-10-CM

## 2016-04-02 ENCOUNTER — Other Ambulatory Visit: Payer: Medicare HMO

## 2016-04-02 DIAGNOSIS — R109 Unspecified abdominal pain: Secondary | ICD-10-CM | POA: Diagnosis not present

## 2016-04-03 ENCOUNTER — Telehealth: Payer: Self-pay | Admitting: Physician Assistant

## 2016-04-03 LAB — HELICOBACTER PYLORI  SPECIAL ANTIGEN: H. PYLORI Antigen: DETECTED

## 2016-04-03 NOTE — Telephone Encounter (Signed)
Patient notified that the labs have not resulted yet and that we will call with the results as soon as they do.

## 2016-04-04 ENCOUNTER — Other Ambulatory Visit: Payer: Self-pay

## 2016-04-04 ENCOUNTER — Telehealth: Payer: Self-pay | Admitting: Internal Medicine

## 2016-04-04 ENCOUNTER — Telehealth: Payer: Self-pay | Admitting: *Deleted

## 2016-04-04 MED ORDER — METRONIDAZOLE 500 MG PO TABS: 500.0000 mg | ORAL_TABLET | Freq: Two times a day (BID) | ORAL | 0 refills | Status: DC

## 2016-04-04 MED ORDER — CLARITHROMYCIN 500 MG PO TABS: 500.0000 mg | ORAL_TABLET | Freq: Two times a day (BID) | ORAL | 0 refills | Status: DC

## 2016-04-04 MED ORDER — OMEPRAZOLE 20 MG PO CPDR: 20.0000 mg | DELAYED_RELEASE_CAPSULE | Freq: Two times a day (BID) | ORAL | 0 refills | Status: AC

## 2016-04-04 NOTE — Telephone Encounter (Signed)
Pt lweft msg on triage stating the Ibuprofen 800 mg is starting to mess with his stomach pt is wanting MD to change to high dose Tylenol...[name] [name]

## 2016-04-04 NOTE — Telephone Encounter (Signed)
Notified pt w/MD response pt states the highest mg over the counter is the (650 mg ) tylenol arthritis and which he did purchase was told by pharmacist ok to take 2 every 6 hours. Pt states they are not effective did not work for him. Pharmacist inform him there is a higher tylenol strength but would have to get rx from MD.../lmb

## 2016-04-04 NOTE — Telephone Encounter (Signed)
Patient returning Linda's call about lab results. States to please call on home phone

## 2016-04-04 NOTE — Telephone Encounter (Signed)
He can take tylenol over the counter. Up to 3000 mg per day (which is 1000 mg three times per day).

## 2016-04-04 NOTE — Telephone Encounter (Signed)
See result note.  

## 2016-04-04 NOTE — Telephone Encounter (Signed)
It is not safe to take more than specified in prior message.

## 2016-04-07 ENCOUNTER — Other Ambulatory Visit: Payer: Self-pay

## 2016-04-07 LAB — COLOGUARD: Cologuard: POSITIVE

## 2016-04-08 NOTE — Telephone Encounter (Signed)
Inform pt w/ MD response. May have to make f/u appt if pain is continuously...[name] [name]

## 2016-04-10 ENCOUNTER — Telehealth: Payer: Self-pay | Admitting: Internal Medicine

## 2016-04-10 NOTE — Telephone Encounter (Signed)
A user error has taken place: ERROR °

## 2016-04-16 ENCOUNTER — Telehealth: Payer: Self-pay

## 2016-04-16 NOTE — Telephone Encounter (Signed)
Pt states he will finish H pylori treatment Friday morning. Pt wanted to know if he needs to taper or just stop the treatment. Discussed with pt that he will just be done when the pills are gone. Pt wanted to know when he needs to retested. Discussed with pt that we usually do not retest for about 6-8 weeks. Pts questions were answered.

## 2016-04-22 ENCOUNTER — Telehealth: Payer: Self-pay | Admitting: Internal Medicine

## 2016-04-22 DIAGNOSIS — R109 Unspecified abdominal pain: Secondary | ICD-10-CM

## 2016-04-22 NOTE — Telephone Encounter (Signed)
Referral placed.

## 2016-04-22 NOTE — Telephone Encounter (Signed)
Pt called request referral to go to [place] clinic due to lower abdominal issue. Please call him back.

## 2016-05-09 DIAGNOSIS — A048 Other specified bacterial intestinal infections: Secondary | ICD-10-CM | POA: Diagnosis not present

## 2016-05-09 DIAGNOSIS — Z79899 Other long term (current) drug therapy: Secondary | ICD-10-CM | POA: Diagnosis not present

## 2016-05-09 DIAGNOSIS — K219 Gastro-esophageal reflux disease without esophagitis: Secondary | ICD-10-CM | POA: Diagnosis not present

## 2016-05-09 DIAGNOSIS — R634 Abnormal weight loss: Secondary | ICD-10-CM | POA: Diagnosis not present

## 2016-05-09 DIAGNOSIS — Z7982 Long term (current) use of aspirin: Secondary | ICD-10-CM | POA: Diagnosis not present

## 2016-05-09 DIAGNOSIS — M419 Scoliosis, unspecified: Secondary | ICD-10-CM | POA: Diagnosis not present

## 2016-05-09 DIAGNOSIS — Z87891 Personal history of nicotine dependence: Secondary | ICD-10-CM | POA: Diagnosis not present

## 2016-05-09 DIAGNOSIS — J449 Chronic obstructive pulmonary disease, unspecified: Secondary | ICD-10-CM | POA: Diagnosis not present

## 2016-05-09 DIAGNOSIS — R109 Unspecified abdominal pain: Secondary | ICD-10-CM | POA: Diagnosis not present

## 2016-05-09 DIAGNOSIS — K5900 Constipation, unspecified: Secondary | ICD-10-CM | POA: Diagnosis not present

## 2016-05-09 DIAGNOSIS — R1032 Left lower quadrant pain: Secondary | ICD-10-CM | POA: Diagnosis not present

## 2016-05-09 DIAGNOSIS — R195 Other fecal abnormalities: Secondary | ICD-10-CM | POA: Diagnosis not present

## 2016-05-14 DIAGNOSIS — K3189 Other diseases of stomach and duodenum: Secondary | ICD-10-CM | POA: Diagnosis not present

## 2016-05-14 DIAGNOSIS — D126 Benign neoplasm of colon, unspecified: Secondary | ICD-10-CM | POA: Diagnosis not present

## 2016-05-14 DIAGNOSIS — K2270 Barrett's esophagus without dysplasia: Secondary | ICD-10-CM | POA: Diagnosis not present

## 2016-05-14 DIAGNOSIS — K449 Diaphragmatic hernia without obstruction or gangrene: Secondary | ICD-10-CM | POA: Diagnosis not present

## 2016-05-14 DIAGNOSIS — K210 Gastro-esophageal reflux disease with esophagitis: Secondary | ICD-10-CM | POA: Diagnosis not present

## 2016-05-14 DIAGNOSIS — K644 Residual hemorrhoidal skin tags: Secondary | ICD-10-CM | POA: Diagnosis not present

## 2016-05-14 DIAGNOSIS — K2930 Chronic superficial gastritis without bleeding: Secondary | ICD-10-CM | POA: Diagnosis not present

## 2016-05-14 DIAGNOSIS — R140 Abdominal distension (gaseous): Secondary | ICD-10-CM | POA: Diagnosis not present

## 2016-05-14 DIAGNOSIS — K648 Other hemorrhoids: Secondary | ICD-10-CM | POA: Diagnosis not present

## 2016-05-14 DIAGNOSIS — D120 Benign neoplasm of cecum: Secondary | ICD-10-CM | POA: Diagnosis not present

## 2016-05-23 DIAGNOSIS — N1330 Unspecified hydronephrosis: Secondary | ICD-10-CM | POA: Diagnosis not present

## 2016-05-23 DIAGNOSIS — R198 Other specified symptoms and signs involving the digestive system and abdomen: Secondary | ICD-10-CM | POA: Diagnosis not present

## 2016-05-23 DIAGNOSIS — R9341 Abnormal radiologic findings on diagnostic imaging of renal pelvis, ureter, or bladder: Secondary | ICD-10-CM | POA: Diagnosis not present

## 2016-05-23 DIAGNOSIS — R591 Generalized enlarged lymph nodes: Secondary | ICD-10-CM | POA: Diagnosis not present

## 2016-05-23 DIAGNOSIS — R1030 Lower abdominal pain, unspecified: Secondary | ICD-10-CM | POA: Diagnosis not present

## 2016-05-30 DIAGNOSIS — C689 Malignant neoplasm of urinary organ, unspecified: Secondary | ICD-10-CM | POA: Diagnosis not present

## 2016-05-30 DIAGNOSIS — J449 Chronic obstructive pulmonary disease, unspecified: Secondary | ICD-10-CM | POA: Diagnosis not present

## 2016-05-30 DIAGNOSIS — R590 Localized enlarged lymph nodes: Secondary | ICD-10-CM | POA: Diagnosis not present

## 2016-05-30 DIAGNOSIS — N329 Bladder disorder, unspecified: Secondary | ICD-10-CM | POA: Diagnosis not present

## 2016-05-30 DIAGNOSIS — K219 Gastro-esophageal reflux disease without esophagitis: Secondary | ICD-10-CM | POA: Diagnosis not present

## 2016-05-30 DIAGNOSIS — C772 Secondary and unspecified malignant neoplasm of intra-abdominal lymph nodes: Secondary | ICD-10-CM | POA: Diagnosis not present

## 2016-05-30 DIAGNOSIS — C61 Malignant neoplasm of prostate: Secondary | ICD-10-CM | POA: Diagnosis not present

## 2016-06-02 ENCOUNTER — Encounter: Payer: Medicare HMO | Admitting: Internal Medicine

## 2016-06-04 DIAGNOSIS — C679 Malignant neoplasm of bladder, unspecified: Secondary | ICD-10-CM | POA: Diagnosis not present

## 2016-06-04 DIAGNOSIS — R590 Localized enlarged lymph nodes: Secondary | ICD-10-CM | POA: Diagnosis not present

## 2016-06-05 DIAGNOSIS — N1330 Unspecified hydronephrosis: Secondary | ICD-10-CM | POA: Diagnosis not present

## 2016-06-05 DIAGNOSIS — K219 Gastro-esophageal reflux disease without esophagitis: Secondary | ICD-10-CM | POA: Diagnosis not present

## 2016-06-05 DIAGNOSIS — R590 Localized enlarged lymph nodes: Secondary | ICD-10-CM | POA: Diagnosis not present

## 2016-06-05 DIAGNOSIS — C679 Malignant neoplasm of bladder, unspecified: Secondary | ICD-10-CM | POA: Diagnosis not present

## 2016-06-05 DIAGNOSIS — C7989 Secondary malignant neoplasm of other specified sites: Secondary | ICD-10-CM | POA: Diagnosis not present

## 2016-06-05 DIAGNOSIS — R143 Flatulence: Secondary | ICD-10-CM | POA: Diagnosis not present

## 2016-06-05 DIAGNOSIS — C61 Malignant neoplasm of prostate: Secondary | ICD-10-CM | POA: Diagnosis not present

## 2016-06-05 DIAGNOSIS — M419 Scoliosis, unspecified: Secondary | ICD-10-CM | POA: Diagnosis not present

## 2016-06-05 DIAGNOSIS — J449 Chronic obstructive pulmonary disease, unspecified: Secondary | ICD-10-CM | POA: Diagnosis not present

## 2016-06-05 DIAGNOSIS — N3289 Other specified disorders of bladder: Secondary | ICD-10-CM | POA: Diagnosis not present

## 2016-06-12 DIAGNOSIS — C7989 Secondary malignant neoplasm of other specified sites: Secondary | ICD-10-CM | POA: Diagnosis not present

## 2016-06-12 DIAGNOSIS — K5903 Drug induced constipation: Secondary | ICD-10-CM | POA: Diagnosis not present

## 2016-06-12 DIAGNOSIS — C61 Malignant neoplasm of prostate: Secondary | ICD-10-CM | POA: Diagnosis not present

## 2016-06-12 DIAGNOSIS — T50905A Adverse effect of unspecified drugs, medicaments and biological substances, initial encounter: Secondary | ICD-10-CM | POA: Diagnosis not present

## 2016-06-16 DIAGNOSIS — C679 Malignant neoplasm of bladder, unspecified: Secondary | ICD-10-CM | POA: Diagnosis not present

## 2016-06-24 DIAGNOSIS — C7951 Secondary malignant neoplasm of bone: Secondary | ICD-10-CM | POA: Diagnosis not present

## 2016-06-24 DIAGNOSIS — C61 Malignant neoplasm of prostate: Secondary | ICD-10-CM | POA: Diagnosis not present

## 2016-06-26 DIAGNOSIS — C61 Malignant neoplasm of prostate: Secondary | ICD-10-CM | POA: Diagnosis not present

## 2016-06-26 DIAGNOSIS — C7989 Secondary malignant neoplasm of other specified sites: Secondary | ICD-10-CM | POA: Diagnosis not present

## 2016-06-26 DIAGNOSIS — C679 Malignant neoplasm of bladder, unspecified: Secondary | ICD-10-CM | POA: Diagnosis not present

## 2016-06-29 DIAGNOSIS — M545 Low back pain: Secondary | ICD-10-CM | POA: Diagnosis not present

## 2016-06-30 DIAGNOSIS — C61 Malignant neoplasm of prostate: Secondary | ICD-10-CM | POA: Diagnosis not present

## 2016-07-03 DIAGNOSIS — M545 Low back pain: Secondary | ICD-10-CM | POA: Diagnosis not present

## 2016-07-07 DIAGNOSIS — M545 Low back pain: Secondary | ICD-10-CM | POA: Diagnosis not present

## 2016-07-14 DIAGNOSIS — C7989 Secondary malignant neoplasm of other specified sites: Secondary | ICD-10-CM | POA: Diagnosis not present

## 2016-07-14 DIAGNOSIS — C61 Malignant neoplasm of prostate: Secondary | ICD-10-CM | POA: Diagnosis not present

## 2016-07-14 DIAGNOSIS — R339 Retention of urine, unspecified: Secondary | ICD-10-CM | POA: Diagnosis not present

## 2016-07-18 DIAGNOSIS — M545 Low back pain: Secondary | ICD-10-CM | POA: Diagnosis not present

## 2016-07-24 DIAGNOSIS — C7989 Secondary malignant neoplasm of other specified sites: Secondary | ICD-10-CM | POA: Diagnosis not present

## 2016-07-24 DIAGNOSIS — C61 Malignant neoplasm of prostate: Secondary | ICD-10-CM | POA: Diagnosis not present

## 2016-07-25 DIAGNOSIS — M545 Low back pain: Secondary | ICD-10-CM | POA: Diagnosis not present

## 2016-07-31 DIAGNOSIS — C7989 Secondary malignant neoplasm of other specified sites: Secondary | ICD-10-CM | POA: Diagnosis not present

## 2016-07-31 DIAGNOSIS — C61 Malignant neoplasm of prostate: Secondary | ICD-10-CM | POA: Diagnosis not present

## 2016-08-01 ENCOUNTER — Encounter: Payer: Self-pay | Admitting: Radiation Oncology

## 2016-08-01 DIAGNOSIS — M545 Low back pain: Secondary | ICD-10-CM | POA: Diagnosis not present

## 2016-08-11 NOTE — Progress Notes (Addendum)
Histology and Location of Primary Cancer: Prostate Cancer  Sites of Visceral and Bony Metastatic Disease: Widespread metabolically active osseous metastatic disease involving bilateral ribs, right humeral head, spine, including C1/C2, T11 and T12 vertebral bodies, bilateral iliac wings, sacrum, left lesser and greater trochanters, right ischium and bilateral common, internal, and external iliac lymphadenopathy, 1.9 cm left common iliac node (2:42), 1.6 cm left internal iliac node (2:49).     Location(s) of Symptomatic Metastases: Obstructice urinary symptoms related to prostate cancer.  Past/Anticipated chemotherapy by medical oncology, if any: None  Pain on a scale of 0-10 is: Taking tramadol and Motrin to manage pain associated with scoliosis.    If Spine Met(s), symptoms, if any, include:  Bowel/Bladder retention or incontinence (please describe): Yes, has to self catheterize due to prostate cancer due to incontinence and retention.  Weak Stream, incomplete emptying, nocturia X 2-3  Numbness or weakness in extremities (please describe): no  Current Decadron regimen, if applicable: no  Ambulatory status? Walker? Wheelchair?: Ambulatory  SAFETY ISSUES:  Prior radiation? no  Pacemaker/ICD? no  Possible current pregnancy? no  Is the patient on methotrexate? no  Current Complaints / other details:  Mr. [name] [name] is originally from [place].  He lives in [place] with his wife Baldo Ash.  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  MBA and MS in Engineer, production.  No children.  Wide circle of friends, active in charity, church and Management consultant.  Previous smoker 3 ppd quit in 1977.  Patient presented with a letter to Dr. Tammi Klippel detailing the medical events of recent months. Patient reports he began physical therapy 6  weeks ago and has had five sessions. Explains that months back he had a permanent urinary catheter in for one month. Patient reports he began in and out catheterizing with a 14 french catheter on May 2. Reports mild brief dysuria when catheterizing due to constriction. Denies hematuria. Reports 4 episode of urinary incontinence during the night along with flatus. Reports hot flashes.

## 2016-08-15 DIAGNOSIS — C7951 Secondary malignant neoplasm of bone: Secondary | ICD-10-CM

## 2016-08-15 DIAGNOSIS — C61 Malignant neoplasm of prostate: Secondary | ICD-10-CM | POA: Insufficient documentation

## 2016-08-20 ENCOUNTER — Encounter: Payer: Self-pay | Admitting: Radiation Oncology

## 2016-08-20 ENCOUNTER — Ambulatory Visit
Admission: RE | Admit: 2016-08-20 | Discharge: 2016-08-20 | Disposition: A | Discharge status: Home / Self Care | Payer: Medicare HMO | Source: Ambulatory Visit | Attending: Radiation Oncology | Admitting: Radiation Oncology

## 2016-08-20 DIAGNOSIS — C61 Malignant neoplasm of prostate: Secondary | ICD-10-CM | POA: Diagnosis not present

## 2016-08-20 DIAGNOSIS — C7951 Secondary malignant neoplasm of bone: Secondary | ICD-10-CM | POA: Diagnosis not present

## 2016-08-20 DIAGNOSIS — Z9289 Personal history of other medical treatment: Secondary | ICD-10-CM | POA: Diagnosis not present

## 2016-08-20 DIAGNOSIS — C772 Secondary and unspecified malignant neoplasm of intra-abdominal lymph nodes: Secondary | ICD-10-CM | POA: Diagnosis not present

## 2016-08-20 HISTORY — DX: Malignant neoplasm of prostate: C61

## 2016-08-20 NOTE — Progress Notes (Signed)
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 Prostate cancer metastatic to bone [facility]) C61    C79.51     HISTORY OF PRESENT ILLNESS: [name] [name] is a 76 y.o. male seen at the request of Dr. Bridgett Larsson to discuss the potential role of palliative radiotherapy in the treatment of his newly diagnosed metastatic prostate cancer.  He has obstructive urinary symptoms, requiring self-catheterization, felt to be secondary to the prostate cancer. The patient initially presented to the [facility] geriatric clinic with complaints of abdominal pain and weight loss on 05/09/2016. He underwent colonoscopy and EGD on 05/14/2016 which were both negative for malignancy.  A CT A&P was performed on 05/23/2016 for further evaluation revealing bulky retroperitoneal, bilateral iliac, and bilateral pelvic sidewall lymphadenopathy as well as mild to moderate hydroureteronephrosis and bladder wall thickening.  He underwent CT-guided biopsy of a retroperitoneal lymph node on 05/30/2016 which revealed a poorly differentiated non-small cell carcinoma consistent with prostate origin.  CT chest was performed for disease staging on 06/04/2016 revealing a 1.4 cm left paratracheal lymph node without additional findings.  A PSA was obtained on 06/05/2016 and was noted to be significantly elevated at 13.20. By patient report, his last prior PSA in 2016 was normal. He had a physical exam in 10/2015 but a PSA was not performed.  He was referred for evaluation with Dr. Berline Lopes in medical oncology on 06/05/2016 and was started on androgen deprivation therapy with Casodex,  Firmagon and [place]. Zytiga started later on 06/28/16 due to insurance approval. His current prostate cancer treatment consists of Casodex, Lupron injection, Zytiga and prednisone.  Bone scan performed on 06/24/2016 demonstrated widespread osseous metastases involving bilateral ribs, right humeral head, spine at C1-C2 and T11-T12, bilateral iliac wings, sacrum, left femur and right ischium.  He denies having any bony pain.  He reports taking Ibuprofen and Tramadol daily for longstanding scoliosis but denies any new aches or pains.  He met with Dr. Bridgett Larsson in radiation oncology at [facility] to discuss potential palliative radiotherapy to the prostate for durable local control of his disease. Dr. Bridgett Larsson recommended a hypofractionated regimen of 70 gy in 28 fractions delivered over the course of 5-6 weeks. The patient is interested in radiotherapy but lives here in [place] and therefore wishes to have his treatment here locally for convenience.  He was therefore referred to Dr. Tammi Klippel to discuss receiving his care here in [place]. He presents to the clinic today accompanied by his wife, Baldo Ash.  The patient reports he is taking calcium and Vitamin D3 daily. He has participated in physical therapy, and reports this has allowed him to walk over 2 miles every day to stay active.   PREVIOUS RADIATION THERAPY: No  PAST MEDICAL HISTORY:  Past Medical History:  Diagnosis Date  . BACKACHE NOS   . Carbuncle and furuncle of trunk   . COPD (chronic obstructive pulmonary disease) ([place])   . GERD (gastroesophageal reflux disease)   . Prostate cancer ([place])   . Right rib fracture 05/2012   traumatic, min displaced on cxr  . Scoliosis   . Sebaceous cyst  PAST SURGICAL HISTORY: Past Surgical History:  Procedure Laterality Date  . EXCISIONAL HEMORRHOIDECTOMY    . TONSILLECTOMY      FAMILY HISTORY:  Family History  Problem Relation Age of Onset  . Alzheimer's disease Mother   . Diabetes  Mother   . Diabetes Father   . Cirrhosis Paternal Grandfather   . Colon cancer Neg Hx   . Esophageal cancer Neg Hx   . Rectal cancer Neg Hx   . Stomach cancer Neg Hx   . Liver cancer Neg Hx   . Prostate cancer Neg Hx     SOCIAL HISTORY:  Social History   Social History  . Marital status: Married    Spouse name: N/A  . Number of children: 0  . Years of education: N/A   Occupational History  . retired    Social History Main Topics  . Smoking status: Former Smoker    Packs/day: 3.00    Years: 15.00    Types: Cigarettes    Quit date: 03/04/1975  . 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 Alcohol use 0.0 oz/week     Comment: 1 glass wine a month  . Drug use: No  . Sexual activity: Not Currently   Other Topics Concern  . Not on file   Social History Narrative   Married 1968   Retired Programme researcher, broadcasting/film/video; very active in politics   SO in good health-well controlled blood pressure, obesity    ALLERGIES: Latex  MEDICATIONS:  Current Outpatient Prescriptions  Medication Sig Dispense Refill  . abiraterone Acetate (ZYTIGA) 250 MG tablet Take 1,000 mg by mouth.    Marland Kitchen acetaminophen (TYLENOL) 325 MG tablet Take 650 mg by mouth.    . Ascorbic Acid (VITAMIN C WITH ROSE HIPS) 1000 MG tablet Take 1,000 mg by mouth daily.      Marland Kitchen aspirin 81 MG tablet Take 81 mg by mouth every other day.     Marland Kitchen azithromycin (ZITHROMAX Z-PAK) 250 MG tablet As directed 6 tablet 0  . bicalutamide (CASODEX) 50 MG tablet Take 50 mg by mouth.    . Calcium Carbonate-Vit D-Min (CALCIUM 600+D PLUS MINERALS) 600-400 MG-UNIT TABS Take by mouth.    . Calcium Carbonate-Vitamin D (CALCIUM 600-D) 600-400 MG-UNIT per tablet Take 1 tablet by mouth daily.      . Cholecalciferol (VITAMIN D3) 5000 units CAPS Take by mouth daily.    . clarithromycin (BIAXIN) 500 MG tablet Take 1 tablet (500 mg total) by mouth 2 (two) times daily. 28 tablet 0  . Coenzyme Q10 (COQ10 PO) Take by  mouth daily.    Marland Kitchen ibuprofen (ADVIL,MOTRIN) 800 MG tablet Take 1 tablet (800 mg total) by mouth every 8 (eight) hours as needed. 90 tablet 3  . metroNIDAZOLE (FLAGYL) 500 MG tablet Take 1 tablet (500 mg total) by mouth 2 (two) times daily. 28 tablet 0  . Multiple Vitamins-Minerals (EYE VITAMINS & MINERALS) TABS Take by mouth.    . Multiple Vitamins-Minerals (MENS MULTIVITAMIN PLUS PO) Take by mouth daily.      Marland Kitchen omeprazole (PRILOSEC) 20 MG capsule Take 1 capsule (20 mg total) by mouth 2 (two) times daily before a meal. 28 capsule 0  . predniSONE (DELTASONE) 5 MG tablet Take 5 mg by mouth.    . senna (SENOKOT) 8.6 MG tablet Take by mouth.    . traMADol (ULTRAM) 50 MG tablet Take 25 mg by mouth.    Marland Kitchen  vitamin B-12 (CYANOCOBALAMIN) 1000 MCG tablet Take 1,000 mcg by mouth every other day.     . vitamin E (VITAMIN E) 400 UNIT capsule Take 400 Units by mouth every other day.      No current facility-administered medications for this encounter.     REVIEW OF SYSTEMS:  On review of systems, the patient reports that he is doing well overall. He reports pain associated with scoliosis for which he is taking tramadol and Motrin 800 mg daily. Otherwise, the patient denies pain related to his metastatic disease. He denies any chest pain, shortness of breath, cough, fevers, chills, night sweats, unintended weight changes. He notes he does have COPD, but it has not "flared up" in over 2 years. He reports urinary retention and incontinence and must self catheterize due to prostate cancer. He also reports a weak stream, incomplete emptying, and nocturia x 2-3. He denies any bowel disturbances, and denies abdominal pain, nausea or vomiting. He denies numbness or weakness in extremities. Denies edema. A complete review of systems is obtained and is otherwise negative.   PHYSICAL EXAM:  Wt Readings from Last 3 Encounters:  08/20/16 138 lb 6.4 oz (62.8 kg)  03/31/16 152 lb (68.9 kg)  03/11/16 154 lb 4 oz (70 kg)    Temp Readings from Last 3 Encounters:  08/20/16 98.4 F (36.9 C) (Oral)  11/27/15 98.4 F (36.9 C)  10/12/15 98.9 F (37.2 C) (Oral)   BP Readings from Last 3 Encounters:  08/20/16 122/82  03/31/16 122/68  03/11/16 124/84   Pulse Readings from Last 3 Encounters:  08/20/16 89  03/31/16 70  03/11/16 84   In general this is a well appearing Caucasian man in no acute distress. He is alert and oriented x4 and appropriate throughout the examination. HEENT reveals that the patient is normocephalic, atraumatic. Skin is intact without any evidence of gross lesions. Cardiovascular exam reveals a regular rate and rhythm, no clicks rubs or murmurs are auscultated. Chest is clear to auscultation bilaterally. Lower extremities are negative for pretibial pitting edema, deep calf tenderness, cyanosis or clubbing.  KPS = 100  LABORATORY DATA:  Lab Results  Component Value Date   WBC 5.8 10/12/2015   HGB 14.9 10/12/2015   HCT 43.7 10/12/2015   MCV 99.2 10/12/2015   PLT 234.0 10/12/2015   Lab Results  Component Value Date   NA 132 (L) 10/12/2015   K 4.4 10/12/2015   CL 96 10/12/2015   CO2 32 10/12/2015   Lab Results  Component Value Date   ALT 10 10/12/2015   AST 17 10/12/2015   ALKPHOS 84 10/12/2015   BILITOT 0.7 10/12/2015     RADIOGRAPHY: No results found.    IMPRESSION/PLAN: 1. 76 y.o. with newly diagnosed metastatic prostate cancer.  Today we reviewed the findings and workup thus far.  We discussed the natural history of metastatic prostate cancer.  We reviewed the the implications of T-stage, Gleason's Score, and PSA on decision-making and outcomes in prostate cancer.  We discussed radiation treatment in the management of metastatic prostate cancer with regard to the logistics and delivery of palliative external beam radiotherapy. The patient expressed interest in external beam radiotherapy.   The patient would like to proceed with palliative prostate IMRT. The patient  needs a referral to Urology to move forward with scheduling placement of three gold fiducial markers into the prostate to proceed with IMRT in the near future. He would like to research local Urologists and call with his preference  for referral since his urologist at [facility], Dr. Sherral Hammers, is relocating in the near future and he prefers to transfer all of his care here to [facility].  Today we also discussed the potential indication for palliative external beam radiotherapy to the sites of bony metastasis in the future should he develop pain at any of the involved sites.  We enjoyed meeting with him today, and will look forward to participating in the care of this very nice gentleman.  We spent 60 minutes face to face with the patient and more than 50% of that time was spent in counseling and/or coordination of care.    Nicholos Johns, PA-C    Tyler Pita, MD  [facility] Direct Dial: [phone]  Fax: [phone] [email or web address]  Skype  [email or web address]  This document serves as a record of services personally performed by Tyler Pita, MD and Freeman Caldron, PA-C. It was created on their behalf by Maryla Morrow, a trained medical scribe. The creation of this record is based on the scribe's personal observations and the provider's statements to them. This document has been checked and approved by the attending provider.

## 2016-08-20 NOTE — Progress Notes (Signed)
See progress note under physician encounter. 

## 2016-08-21 ENCOUNTER — Telehealth: Payer: Self-pay | Admitting: *Deleted

## 2016-08-21 ENCOUNTER — Telehealth: Payer: Self-pay | Admitting: Radiation Oncology

## 2016-08-21 MED ORDER — IBUPROFEN 800 MG PO TABS: 800.0000 mg | ORAL_TABLET | Freq: Three times a day (TID) | ORAL | 1 refills | Status: DC | PRN

## 2016-08-21 NOTE — Telephone Encounter (Signed)
Pt left msg on triage stating he is needing to get a refill on the Ibuprofen 800 mg. MD out of office pls advise...[name] [name]

## 2016-08-21 NOTE — Telephone Encounter (Signed)
Medication sent to pharmacy  

## 2016-08-21 NOTE — Telephone Encounter (Signed)
Notifed pt refill ha been sent to pof...[name] [name]

## 2016-08-21 NOTE — Telephone Encounter (Signed)
Received voicemail message from patient requesting return call. Phoned patient back. Patient confirms he has researched urologist and would like a referral to Dr. Alinda Money (first choice) or Dr. Jeffie Pollock (second). Patient understands this RN will inform the providers of this finding. Encouraged patient to call this RN back if he hasn't received an appointment with [facility] by 1300 Monday, June 25th. Patient verbalized understanding and expressed appreciation for the call.

## 2016-08-22 ENCOUNTER — Other Ambulatory Visit: Payer: Self-pay | Admitting: Radiation Oncology

## 2016-08-22 DIAGNOSIS — C61 Malignant neoplasm of prostate: Secondary | ICD-10-CM

## 2016-08-22 DIAGNOSIS — C7951 Secondary malignant neoplasm of bone: Principal | ICD-10-CM

## 2016-08-22 NOTE — Telephone Encounter (Signed)
done

## 2016-08-25 DIAGNOSIS — R339 Retention of urine, unspecified: Secondary | ICD-10-CM | POA: Diagnosis not present

## 2016-09-01 ENCOUNTER — Ambulatory Visit
Admission: RE | Admit: 2016-09-01 | Discharge: 2016-09-01 | Disposition: A | Discharge status: Home / Self Care | Admitting: Urology

## 2016-09-01 DIAGNOSIS — C61 Malignant neoplasm of prostate: Principal | ICD-10-CM

## 2016-09-04 ENCOUNTER — Ambulatory Visit
Admission: RE | Admit: 2016-09-04 | Discharge: 2016-09-04 | Disposition: A | Discharge status: Home / Self Care | Payer: MEDICARE

## 2016-09-04 DIAGNOSIS — C61 Malignant neoplasm of prostate: Principal | ICD-10-CM

## 2016-09-04 DIAGNOSIS — C7989 Secondary malignant neoplasm of other specified sites: Secondary | ICD-10-CM

## 2016-09-04 DIAGNOSIS — R9341 Abnormal radiologic findings on diagnostic imaging of renal pelvis, ureter, or bladder: Secondary | ICD-10-CM | POA: Diagnosis not present

## 2016-09-04 DIAGNOSIS — C771 Secondary and unspecified malignant neoplasm of intrathoracic lymph nodes: Secondary | ICD-10-CM | POA: Diagnosis not present

## 2016-09-04 DIAGNOSIS — C7951 Secondary malignant neoplasm of bone: Secondary | ICD-10-CM | POA: Diagnosis not present

## 2016-09-04 DIAGNOSIS — M419 Scoliosis, unspecified: Secondary | ICD-10-CM | POA: Diagnosis not present

## 2016-09-04 DIAGNOSIS — Z5112 Encounter for antineoplastic immunotherapy: Secondary | ICD-10-CM | POA: Diagnosis not present

## 2016-09-04 DIAGNOSIS — J449 Chronic obstructive pulmonary disease, unspecified: Secondary | ICD-10-CM | POA: Diagnosis not present

## 2016-09-04 DIAGNOSIS — K5903 Drug induced constipation: Secondary | ICD-10-CM | POA: Diagnosis not present

## 2016-09-15 ENCOUNTER — Telehealth: Payer: Self-pay | Admitting: *Deleted

## 2016-09-15 NOTE — Telephone Encounter (Signed)
Called patient to remind of appt. with Dr. Alinda Money on 09-16-16- arrival time - 3:45 pm, no answer.

## 2016-09-16 DIAGNOSIS — C61 Malignant neoplasm of prostate: Secondary | ICD-10-CM | POA: Diagnosis not present

## 2016-09-16 DIAGNOSIS — C7951 Secondary malignant neoplasm of bone: Secondary | ICD-10-CM | POA: Diagnosis not present

## 2016-09-16 DIAGNOSIS — R338 Other retention of urine: Secondary | ICD-10-CM | POA: Diagnosis not present

## 2016-09-16 DIAGNOSIS — C775 Secondary and unspecified malignant neoplasm of intrapelvic lymph nodes: Secondary | ICD-10-CM | POA: Diagnosis not present

## 2016-10-01 ENCOUNTER — Ambulatory Visit
Admission: RE | Admit: 2016-10-01 | Discharge: 2016-10-31 | Disposition: A | Discharge status: Home / Self Care | Payer: MEDICARE | Attending: Radiation Oncology | Admitting: Radiation Oncology

## 2016-10-01 ENCOUNTER — Ambulatory Visit
Admission: RE | Admit: 2016-10-01 | Discharge: 2016-10-31 | Disposition: A | Discharge status: Home / Self Care | Payer: MEDICARE

## 2016-10-01 ENCOUNTER — Ambulatory Visit: Admission: RE | Admit: 2016-10-01 | Discharge: 2016-10-31 | Disposition: A | Discharge status: Home / Self Care

## 2016-10-01 DIAGNOSIS — C7951 Secondary malignant neoplasm of bone: Secondary | ICD-10-CM

## 2016-10-01 DIAGNOSIS — C61 Malignant neoplasm of prostate: Secondary | ICD-10-CM

## 2016-10-01 DIAGNOSIS — Z510 Encounter for antineoplastic radiation therapy: Principal | ICD-10-CM

## 2016-10-02 DIAGNOSIS — C61 Malignant neoplasm of prostate: Secondary | ICD-10-CM

## 2016-10-02 DIAGNOSIS — C7989 Secondary malignant neoplasm of other specified sites: Secondary | ICD-10-CM

## 2016-10-02 DIAGNOSIS — C7951 Secondary malignant neoplasm of bone: Secondary | ICD-10-CM

## 2016-10-02 DIAGNOSIS — Z510 Encounter for antineoplastic radiation therapy: Principal | ICD-10-CM

## 2016-10-02 DIAGNOSIS — N139 Obstructive and reflux uropathy, unspecified: Secondary | ICD-10-CM | POA: Diagnosis not present

## 2016-10-02 DIAGNOSIS — R300 Dysuria: Secondary | ICD-10-CM | POA: Diagnosis not present

## 2016-10-02 DIAGNOSIS — R339 Retention of urine, unspecified: Secondary | ICD-10-CM | POA: Diagnosis not present

## 2016-10-07 DIAGNOSIS — R339 Retention of urine, unspecified: Secondary | ICD-10-CM | POA: Diagnosis not present

## 2016-10-13 DIAGNOSIS — C7951 Secondary malignant neoplasm of bone: Secondary | ICD-10-CM | POA: Diagnosis not present

## 2016-10-13 DIAGNOSIS — Z510 Encounter for antineoplastic radiation therapy: Secondary | ICD-10-CM | POA: Diagnosis not present

## 2016-10-13 DIAGNOSIS — R300 Dysuria: Secondary | ICD-10-CM | POA: Diagnosis not present

## 2016-10-13 DIAGNOSIS — R339 Retention of urine, unspecified: Secondary | ICD-10-CM | POA: Diagnosis not present

## 2016-10-13 DIAGNOSIS — C61 Malignant neoplasm of prostate: Secondary | ICD-10-CM | POA: Diagnosis not present

## 2016-10-13 DIAGNOSIS — N139 Obstructive and reflux uropathy, unspecified: Secondary | ICD-10-CM | POA: Diagnosis not present

## 2016-10-16 DIAGNOSIS — Z510 Encounter for antineoplastic radiation therapy: Principal | ICD-10-CM

## 2016-10-16 DIAGNOSIS — C7951 Secondary malignant neoplasm of bone: Secondary | ICD-10-CM

## 2016-10-16 DIAGNOSIS — C61 Malignant neoplasm of prostate: Secondary | ICD-10-CM

## 2016-10-16 DIAGNOSIS — N139 Obstructive and reflux uropathy, unspecified: Secondary | ICD-10-CM | POA: Diagnosis not present

## 2016-10-16 DIAGNOSIS — R300 Dysuria: Secondary | ICD-10-CM | POA: Diagnosis not present

## 2016-10-16 DIAGNOSIS — R339 Retention of urine, unspecified: Secondary | ICD-10-CM | POA: Diagnosis not present

## 2016-10-17 DIAGNOSIS — Z510 Encounter for antineoplastic radiation therapy: Principal | ICD-10-CM

## 2016-10-17 DIAGNOSIS — C61 Malignant neoplasm of prostate: Secondary | ICD-10-CM

## 2016-10-17 DIAGNOSIS — C7951 Secondary malignant neoplasm of bone: Secondary | ICD-10-CM

## 2016-10-17 DIAGNOSIS — N139 Obstructive and reflux uropathy, unspecified: Secondary | ICD-10-CM | POA: Diagnosis not present

## 2016-10-17 DIAGNOSIS — R300 Dysuria: Secondary | ICD-10-CM | POA: Diagnosis not present

## 2016-10-17 DIAGNOSIS — R339 Retention of urine, unspecified: Secondary | ICD-10-CM | POA: Diagnosis not present

## 2016-10-20 DIAGNOSIS — C7989 Secondary malignant neoplasm of other specified sites: Secondary | ICD-10-CM

## 2016-10-20 DIAGNOSIS — C7951 Secondary malignant neoplasm of bone: Secondary | ICD-10-CM

## 2016-10-20 DIAGNOSIS — C61 Malignant neoplasm of prostate: Principal | ICD-10-CM

## 2016-10-20 DIAGNOSIS — Z510 Encounter for antineoplastic radiation therapy: Principal | ICD-10-CM

## 2016-10-20 DIAGNOSIS — R300 Dysuria: Secondary | ICD-10-CM | POA: Diagnosis not present

## 2016-10-20 DIAGNOSIS — R339 Retention of urine, unspecified: Secondary | ICD-10-CM | POA: Diagnosis not present

## 2016-10-20 DIAGNOSIS — N139 Obstructive and reflux uropathy, unspecified: Secondary | ICD-10-CM | POA: Diagnosis not present

## 2016-10-21 DIAGNOSIS — C7951 Secondary malignant neoplasm of bone: Secondary | ICD-10-CM

## 2016-10-21 DIAGNOSIS — Z510 Encounter for antineoplastic radiation therapy: Principal | ICD-10-CM

## 2016-10-21 DIAGNOSIS — C61 Malignant neoplasm of prostate: Secondary | ICD-10-CM

## 2016-10-21 DIAGNOSIS — N139 Obstructive and reflux uropathy, unspecified: Secondary | ICD-10-CM | POA: Diagnosis not present

## 2016-10-21 DIAGNOSIS — R300 Dysuria: Secondary | ICD-10-CM | POA: Diagnosis not present

## 2016-10-21 DIAGNOSIS — R339 Retention of urine, unspecified: Secondary | ICD-10-CM | POA: Diagnosis not present

## 2016-10-22 ENCOUNTER — Ambulatory Visit
Admission: RE | Admit: 2016-10-22 | Discharge: 2016-10-22 | Disposition: A | Discharge status: Home / Self Care | Payer: MEDICARE

## 2016-10-22 ENCOUNTER — Ambulatory Visit
Admission: RE | Admit: 2016-10-22 | Discharge: 2016-10-22 | Disposition: A | Discharge status: Home / Self Care | Payer: MEDICARE | Attending: Pharmacist Clinician (PhC)/ Clinical Pharmacy Specialist | Admitting: Pharmacist Clinician (PhC)/ Clinical Pharmacy Specialist

## 2016-10-22 DIAGNOSIS — C7989 Secondary malignant neoplasm of other specified sites: Secondary | ICD-10-CM

## 2016-10-22 DIAGNOSIS — C7951 Secondary malignant neoplasm of bone: Secondary | ICD-10-CM

## 2016-10-22 DIAGNOSIS — C61 Malignant neoplasm of prostate: Principal | ICD-10-CM

## 2016-10-22 DIAGNOSIS — Z510 Encounter for antineoplastic radiation therapy: Principal | ICD-10-CM

## 2016-10-22 DIAGNOSIS — Z79899 Other long term (current) drug therapy: Secondary | ICD-10-CM | POA: Diagnosis not present

## 2016-10-22 DIAGNOSIS — R339 Retention of urine, unspecified: Secondary | ICD-10-CM | POA: Diagnosis not present

## 2016-10-22 DIAGNOSIS — Z7952 Long term (current) use of systemic steroids: Secondary | ICD-10-CM | POA: Diagnosis not present

## 2016-10-22 DIAGNOSIS — R300 Dysuria: Secondary | ICD-10-CM | POA: Diagnosis not present

## 2016-10-22 DIAGNOSIS — N139 Obstructive and reflux uropathy, unspecified: Secondary | ICD-10-CM | POA: Diagnosis not present

## 2016-10-22 DIAGNOSIS — R232 Flushing: Secondary | ICD-10-CM | POA: Diagnosis not present

## 2016-10-22 DIAGNOSIS — T451X5D Adverse effect of antineoplastic and immunosuppressive drugs, subsequent encounter: Secondary | ICD-10-CM | POA: Diagnosis not present

## 2016-10-22 MED ORDER — GABAPENTIN 300 MG CAPSULE
ORAL_CAPSULE | 2 refills | 0 days | Status: CP
Start: 2016-10-22 — End: 2016-11-24

## 2016-10-23 DIAGNOSIS — C7951 Secondary malignant neoplasm of bone: Secondary | ICD-10-CM

## 2016-10-23 DIAGNOSIS — Z510 Encounter for antineoplastic radiation therapy: Principal | ICD-10-CM

## 2016-10-23 DIAGNOSIS — C61 Malignant neoplasm of prostate: Secondary | ICD-10-CM

## 2016-10-23 DIAGNOSIS — N139 Obstructive and reflux uropathy, unspecified: Secondary | ICD-10-CM | POA: Diagnosis not present

## 2016-10-23 DIAGNOSIS — R300 Dysuria: Secondary | ICD-10-CM | POA: Diagnosis not present

## 2016-10-23 DIAGNOSIS — R339 Retention of urine, unspecified: Secondary | ICD-10-CM | POA: Diagnosis not present

## 2016-10-24 DIAGNOSIS — Z510 Encounter for antineoplastic radiation therapy: Principal | ICD-10-CM

## 2016-10-24 DIAGNOSIS — C61 Malignant neoplasm of prostate: Secondary | ICD-10-CM

## 2016-10-24 DIAGNOSIS — C7951 Secondary malignant neoplasm of bone: Secondary | ICD-10-CM

## 2016-10-24 DIAGNOSIS — R339 Retention of urine, unspecified: Secondary | ICD-10-CM | POA: Diagnosis not present

## 2016-10-24 DIAGNOSIS — R300 Dysuria: Secondary | ICD-10-CM | POA: Diagnosis not present

## 2016-10-24 DIAGNOSIS — N139 Obstructive and reflux uropathy, unspecified: Secondary | ICD-10-CM | POA: Diagnosis not present

## 2016-10-27 DIAGNOSIS — C61 Malignant neoplasm of prostate: Principal | ICD-10-CM

## 2016-10-27 DIAGNOSIS — Z510 Encounter for antineoplastic radiation therapy: Principal | ICD-10-CM

## 2016-10-27 DIAGNOSIS — C7951 Secondary malignant neoplasm of bone: Secondary | ICD-10-CM

## 2016-10-27 DIAGNOSIS — C7989 Secondary malignant neoplasm of other specified sites: Secondary | ICD-10-CM

## 2016-10-27 DIAGNOSIS — N139 Obstructive and reflux uropathy, unspecified: Secondary | ICD-10-CM | POA: Diagnosis not present

## 2016-10-27 DIAGNOSIS — R339 Retention of urine, unspecified: Secondary | ICD-10-CM | POA: Diagnosis not present

## 2016-10-27 DIAGNOSIS — R300 Dysuria: Secondary | ICD-10-CM | POA: Diagnosis not present

## 2016-10-28 DIAGNOSIS — Z510 Encounter for antineoplastic radiation therapy: Principal | ICD-10-CM

## 2016-10-28 DIAGNOSIS — C61 Malignant neoplasm of prostate: Secondary | ICD-10-CM

## 2016-10-28 DIAGNOSIS — C7951 Secondary malignant neoplasm of bone: Secondary | ICD-10-CM

## 2016-10-28 DIAGNOSIS — R300 Dysuria: Secondary | ICD-10-CM | POA: Diagnosis not present

## 2016-10-28 DIAGNOSIS — N139 Obstructive and reflux uropathy, unspecified: Secondary | ICD-10-CM | POA: Diagnosis not present

## 2016-10-28 DIAGNOSIS — R339 Retention of urine, unspecified: Secondary | ICD-10-CM | POA: Diagnosis not present

## 2016-10-29 DIAGNOSIS — C61 Malignant neoplasm of prostate: Secondary | ICD-10-CM

## 2016-10-29 DIAGNOSIS — C7951 Secondary malignant neoplasm of bone: Secondary | ICD-10-CM

## 2016-10-29 DIAGNOSIS — Z510 Encounter for antineoplastic radiation therapy: Principal | ICD-10-CM

## 2016-10-29 DIAGNOSIS — N139 Obstructive and reflux uropathy, unspecified: Secondary | ICD-10-CM | POA: Diagnosis not present

## 2016-10-29 DIAGNOSIS — R339 Retention of urine, unspecified: Secondary | ICD-10-CM | POA: Diagnosis not present

## 2016-10-29 DIAGNOSIS — R300 Dysuria: Secondary | ICD-10-CM | POA: Diagnosis not present

## 2016-10-30 DIAGNOSIS — C61 Malignant neoplasm of prostate: Secondary | ICD-10-CM

## 2016-10-30 DIAGNOSIS — C7951 Secondary malignant neoplasm of bone: Secondary | ICD-10-CM

## 2016-10-30 DIAGNOSIS — Z510 Encounter for antineoplastic radiation therapy: Principal | ICD-10-CM

## 2016-10-30 DIAGNOSIS — R300 Dysuria: Secondary | ICD-10-CM | POA: Diagnosis not present

## 2016-10-30 DIAGNOSIS — N139 Obstructive and reflux uropathy, unspecified: Secondary | ICD-10-CM | POA: Diagnosis not present

## 2016-10-30 DIAGNOSIS — R339 Retention of urine, unspecified: Secondary | ICD-10-CM | POA: Diagnosis not present

## 2016-10-31 DIAGNOSIS — C61 Malignant neoplasm of prostate: Secondary | ICD-10-CM

## 2016-10-31 DIAGNOSIS — Z510 Encounter for antineoplastic radiation therapy: Principal | ICD-10-CM

## 2016-10-31 DIAGNOSIS — C7951 Secondary malignant neoplasm of bone: Secondary | ICD-10-CM

## 2016-10-31 DIAGNOSIS — R339 Retention of urine, unspecified: Secondary | ICD-10-CM | POA: Diagnosis not present

## 2016-10-31 DIAGNOSIS — N139 Obstructive and reflux uropathy, unspecified: Secondary | ICD-10-CM | POA: Diagnosis not present

## 2016-10-31 DIAGNOSIS — R300 Dysuria: Secondary | ICD-10-CM | POA: Diagnosis not present

## 2016-11-04 ENCOUNTER — Ambulatory Visit
Admission: RE | Admit: 2016-11-04 | Discharge: 2016-11-30 | Disposition: A | Discharge status: Home / Self Care | Payer: MEDICARE

## 2016-11-04 ENCOUNTER — Ambulatory Visit
Admission: RE | Admit: 2016-11-04 | Discharge: 2016-11-30 | Disposition: A | Discharge status: Home / Self Care | Payer: MEDICARE | Attending: Radiation Oncology | Admitting: Radiation Oncology

## 2016-11-04 ENCOUNTER — Ambulatory Visit: Admission: RE | Admit: 2016-11-04 | Discharge: 2016-11-30 | Disposition: A | Discharge status: Home / Self Care

## 2016-11-04 DIAGNOSIS — Z510 Encounter for antineoplastic radiation therapy: Principal | ICD-10-CM

## 2016-11-04 DIAGNOSIS — C61 Malignant neoplasm of prostate: Secondary | ICD-10-CM

## 2016-11-04 DIAGNOSIS — C7951 Secondary malignant neoplasm of bone: Secondary | ICD-10-CM

## 2016-11-04 DIAGNOSIS — N139 Obstructive and reflux uropathy, unspecified: Secondary | ICD-10-CM | POA: Diagnosis not present

## 2016-11-04 DIAGNOSIS — R300 Dysuria: Secondary | ICD-10-CM | POA: Diagnosis not present

## 2016-11-04 DIAGNOSIS — R339 Retention of urine, unspecified: Secondary | ICD-10-CM | POA: Diagnosis not present

## 2016-11-05 DIAGNOSIS — C7951 Secondary malignant neoplasm of bone: Secondary | ICD-10-CM

## 2016-11-05 DIAGNOSIS — C61 Malignant neoplasm of prostate: Secondary | ICD-10-CM

## 2016-11-05 DIAGNOSIS — Z510 Encounter for antineoplastic radiation therapy: Principal | ICD-10-CM

## 2016-11-05 DIAGNOSIS — N139 Obstructive and reflux uropathy, unspecified: Secondary | ICD-10-CM | POA: Diagnosis not present

## 2016-11-05 DIAGNOSIS — R300 Dysuria: Secondary | ICD-10-CM | POA: Diagnosis not present

## 2016-11-05 DIAGNOSIS — R339 Retention of urine, unspecified: Secondary | ICD-10-CM | POA: Diagnosis not present

## 2016-11-06 DIAGNOSIS — C61 Malignant neoplasm of prostate: Principal | ICD-10-CM

## 2016-11-06 DIAGNOSIS — Z510 Encounter for antineoplastic radiation therapy: Principal | ICD-10-CM

## 2016-11-06 DIAGNOSIS — C7951 Secondary malignant neoplasm of bone: Secondary | ICD-10-CM

## 2016-11-06 DIAGNOSIS — C7989 Secondary malignant neoplasm of other specified sites: Secondary | ICD-10-CM

## 2016-11-06 DIAGNOSIS — N139 Obstructive and reflux uropathy, unspecified: Secondary | ICD-10-CM | POA: Diagnosis not present

## 2016-11-06 DIAGNOSIS — R339 Retention of urine, unspecified: Secondary | ICD-10-CM | POA: Diagnosis not present

## 2016-11-06 DIAGNOSIS — R300 Dysuria: Secondary | ICD-10-CM | POA: Diagnosis not present

## 2016-11-07 DIAGNOSIS — C61 Malignant neoplasm of prostate: Secondary | ICD-10-CM

## 2016-11-07 DIAGNOSIS — C7951 Secondary malignant neoplasm of bone: Secondary | ICD-10-CM

## 2016-11-07 DIAGNOSIS — Z510 Encounter for antineoplastic radiation therapy: Principal | ICD-10-CM

## 2016-11-07 DIAGNOSIS — N139 Obstructive and reflux uropathy, unspecified: Secondary | ICD-10-CM | POA: Diagnosis not present

## 2016-11-07 DIAGNOSIS — R339 Retention of urine, unspecified: Secondary | ICD-10-CM | POA: Diagnosis not present

## 2016-11-07 DIAGNOSIS — R300 Dysuria: Secondary | ICD-10-CM | POA: Diagnosis not present

## 2016-11-10 DIAGNOSIS — C7951 Secondary malignant neoplasm of bone: Secondary | ICD-10-CM

## 2016-11-10 DIAGNOSIS — C61 Malignant neoplasm of prostate: Secondary | ICD-10-CM

## 2016-11-10 DIAGNOSIS — Z510 Encounter for antineoplastic radiation therapy: Principal | ICD-10-CM

## 2016-11-10 DIAGNOSIS — R300 Dysuria: Secondary | ICD-10-CM | POA: Diagnosis not present

## 2016-11-10 DIAGNOSIS — N139 Obstructive and reflux uropathy, unspecified: Secondary | ICD-10-CM | POA: Diagnosis not present

## 2016-11-10 DIAGNOSIS — R339 Retention of urine, unspecified: Secondary | ICD-10-CM | POA: Diagnosis not present

## 2016-11-10 MED ORDER — TAMSULOSIN 0.4 MG CAPSULE
ORAL_CAPSULE | Freq: Every day | ORAL | 3 refills | 0 days | Status: CP
Start: 2016-11-10 — End: 2016-11-24

## 2016-11-11 DIAGNOSIS — C7951 Secondary malignant neoplasm of bone: Secondary | ICD-10-CM

## 2016-11-11 DIAGNOSIS — C61 Malignant neoplasm of prostate: Secondary | ICD-10-CM

## 2016-11-11 DIAGNOSIS — Z510 Encounter for antineoplastic radiation therapy: Principal | ICD-10-CM

## 2016-11-11 DIAGNOSIS — R339 Retention of urine, unspecified: Secondary | ICD-10-CM | POA: Diagnosis not present

## 2016-11-11 DIAGNOSIS — N139 Obstructive and reflux uropathy, unspecified: Secondary | ICD-10-CM | POA: Diagnosis not present

## 2016-11-11 DIAGNOSIS — R300 Dysuria: Secondary | ICD-10-CM | POA: Diagnosis not present

## 2016-11-12 DIAGNOSIS — C7951 Secondary malignant neoplasm of bone: Secondary | ICD-10-CM

## 2016-11-12 DIAGNOSIS — C61 Malignant neoplasm of prostate: Secondary | ICD-10-CM

## 2016-11-12 DIAGNOSIS — Z510 Encounter for antineoplastic radiation therapy: Principal | ICD-10-CM

## 2016-11-12 DIAGNOSIS — N139 Obstructive and reflux uropathy, unspecified: Secondary | ICD-10-CM | POA: Diagnosis not present

## 2016-11-12 DIAGNOSIS — R300 Dysuria: Secondary | ICD-10-CM | POA: Diagnosis not present

## 2016-11-12 DIAGNOSIS — R339 Retention of urine, unspecified: Secondary | ICD-10-CM | POA: Diagnosis not present

## 2016-11-13 DIAGNOSIS — Z510 Encounter for antineoplastic radiation therapy: Principal | ICD-10-CM

## 2016-11-13 DIAGNOSIS — C7951 Secondary malignant neoplasm of bone: Secondary | ICD-10-CM

## 2016-11-13 DIAGNOSIS — C61 Malignant neoplasm of prostate: Secondary | ICD-10-CM

## 2016-11-13 DIAGNOSIS — R300 Dysuria: Secondary | ICD-10-CM | POA: Diagnosis not present

## 2016-11-13 DIAGNOSIS — R339 Retention of urine, unspecified: Secondary | ICD-10-CM | POA: Diagnosis not present

## 2016-11-13 DIAGNOSIS — N139 Obstructive and reflux uropathy, unspecified: Secondary | ICD-10-CM | POA: Diagnosis not present

## 2016-11-17 DIAGNOSIS — C61 Malignant neoplasm of prostate: Secondary | ICD-10-CM

## 2016-11-17 DIAGNOSIS — C7951 Secondary malignant neoplasm of bone: Secondary | ICD-10-CM

## 2016-11-17 DIAGNOSIS — Z510 Encounter for antineoplastic radiation therapy: Principal | ICD-10-CM

## 2016-11-17 DIAGNOSIS — C7989 Secondary malignant neoplasm of other specified sites: Secondary | ICD-10-CM

## 2016-11-17 DIAGNOSIS — R300 Dysuria: Secondary | ICD-10-CM | POA: Diagnosis not present

## 2016-11-17 DIAGNOSIS — N139 Obstructive and reflux uropathy, unspecified: Secondary | ICD-10-CM | POA: Diagnosis not present

## 2016-11-17 DIAGNOSIS — R339 Retention of urine, unspecified: Secondary | ICD-10-CM | POA: Diagnosis not present

## 2016-11-18 DIAGNOSIS — C61 Malignant neoplasm of prostate: Secondary | ICD-10-CM

## 2016-11-18 DIAGNOSIS — Z510 Encounter for antineoplastic radiation therapy: Principal | ICD-10-CM

## 2016-11-18 DIAGNOSIS — C7951 Secondary malignant neoplasm of bone: Secondary | ICD-10-CM

## 2016-11-18 DIAGNOSIS — R300 Dysuria: Secondary | ICD-10-CM | POA: Diagnosis not present

## 2016-11-18 DIAGNOSIS — R339 Retention of urine, unspecified: Secondary | ICD-10-CM | POA: Diagnosis not present

## 2016-11-18 DIAGNOSIS — N139 Obstructive and reflux uropathy, unspecified: Secondary | ICD-10-CM | POA: Diagnosis not present

## 2016-11-19 DIAGNOSIS — Z510 Encounter for antineoplastic radiation therapy: Principal | ICD-10-CM

## 2016-11-19 DIAGNOSIS — C61 Malignant neoplasm of prostate: Secondary | ICD-10-CM

## 2016-11-19 DIAGNOSIS — C7951 Secondary malignant neoplasm of bone: Secondary | ICD-10-CM

## 2016-11-19 DIAGNOSIS — R339 Retention of urine, unspecified: Secondary | ICD-10-CM | POA: Diagnosis not present

## 2016-11-19 DIAGNOSIS — R300 Dysuria: Secondary | ICD-10-CM | POA: Diagnosis not present

## 2016-11-19 DIAGNOSIS — N139 Obstructive and reflux uropathy, unspecified: Secondary | ICD-10-CM | POA: Diagnosis not present

## 2016-11-20 DIAGNOSIS — C7951 Secondary malignant neoplasm of bone: Secondary | ICD-10-CM

## 2016-11-20 DIAGNOSIS — Z510 Encounter for antineoplastic radiation therapy: Principal | ICD-10-CM

## 2016-11-20 DIAGNOSIS — C61 Malignant neoplasm of prostate: Secondary | ICD-10-CM

## 2016-11-20 DIAGNOSIS — N139 Obstructive and reflux uropathy, unspecified: Secondary | ICD-10-CM | POA: Diagnosis not present

## 2016-11-20 DIAGNOSIS — R339 Retention of urine, unspecified: Secondary | ICD-10-CM | POA: Diagnosis not present

## 2016-11-20 DIAGNOSIS — R300 Dysuria: Secondary | ICD-10-CM | POA: Diagnosis not present

## 2016-11-21 DIAGNOSIS — Z510 Encounter for antineoplastic radiation therapy: Principal | ICD-10-CM

## 2016-11-21 DIAGNOSIS — C7951 Secondary malignant neoplasm of bone: Secondary | ICD-10-CM

## 2016-11-21 DIAGNOSIS — C61 Malignant neoplasm of prostate: Secondary | ICD-10-CM

## 2016-11-21 DIAGNOSIS — R339 Retention of urine, unspecified: Secondary | ICD-10-CM | POA: Diagnosis not present

## 2016-11-21 DIAGNOSIS — R300 Dysuria: Secondary | ICD-10-CM | POA: Diagnosis not present

## 2016-11-21 DIAGNOSIS — N139 Obstructive and reflux uropathy, unspecified: Secondary | ICD-10-CM | POA: Diagnosis not present

## 2016-11-24 DIAGNOSIS — C61 Malignant neoplasm of prostate: Secondary | ICD-10-CM

## 2016-11-24 DIAGNOSIS — C7951 Secondary malignant neoplasm of bone: Secondary | ICD-10-CM

## 2016-11-24 DIAGNOSIS — Z510 Encounter for antineoplastic radiation therapy: Principal | ICD-10-CM

## 2016-11-24 DIAGNOSIS — C7989 Secondary malignant neoplasm of other specified sites: Secondary | ICD-10-CM

## 2016-11-24 MED ORDER — TAMSULOSIN 0.4 MG CAPSULE
ORAL_CAPSULE | Freq: Every day | ORAL | 3 refills | 0 days | Status: CP
Start: 2016-11-24 — End: 2016-12-25

## 2016-11-25 DIAGNOSIS — C61 Malignant neoplasm of prostate: Secondary | ICD-10-CM

## 2016-11-25 DIAGNOSIS — Z510 Encounter for antineoplastic radiation therapy: Principal | ICD-10-CM

## 2016-11-25 DIAGNOSIS — C7951 Secondary malignant neoplasm of bone: Secondary | ICD-10-CM

## 2016-11-25 DIAGNOSIS — W57XXXA Bitten or stung by nonvenomous insect and other nonvenomous arthropods, initial encounter: Principal | ICD-10-CM

## 2016-11-25 DIAGNOSIS — C7989 Secondary malignant neoplasm of other specified sites: Secondary | ICD-10-CM

## 2016-11-25 DIAGNOSIS — R339 Retention of urine, unspecified: Secondary | ICD-10-CM | POA: Diagnosis not present

## 2016-11-25 DIAGNOSIS — N139 Obstructive and reflux uropathy, unspecified: Secondary | ICD-10-CM | POA: Diagnosis not present

## 2016-11-25 DIAGNOSIS — R300 Dysuria: Secondary | ICD-10-CM | POA: Diagnosis not present

## 2016-11-26 DIAGNOSIS — C61 Malignant neoplasm of prostate: Secondary | ICD-10-CM

## 2016-11-26 DIAGNOSIS — C7951 Secondary malignant neoplasm of bone: Secondary | ICD-10-CM

## 2016-11-26 DIAGNOSIS — Z510 Encounter for antineoplastic radiation therapy: Principal | ICD-10-CM

## 2016-12-01 MED ORDER — CATHETER 14 FR
Freq: Every day | 3 refills | 0 days | Status: CP
Start: 2016-12-01 — End: 2017-03-12

## 2016-12-04 ENCOUNTER — Ambulatory Visit
Admission: RE | Admit: 2016-12-04 | Discharge: 2016-12-04 | Disposition: A | Discharge status: Home / Self Care | Payer: MEDICARE

## 2016-12-04 DIAGNOSIS — C61 Malignant neoplasm of prostate: Principal | ICD-10-CM

## 2016-12-04 DIAGNOSIS — C7989 Secondary malignant neoplasm of other specified sites: Secondary | ICD-10-CM

## 2016-12-04 DIAGNOSIS — Z5112 Encounter for antineoplastic immunotherapy: Secondary | ICD-10-CM | POA: Diagnosis not present

## 2016-12-09 MED ORDER — PREDNISONE 5 MG TABLET
ORAL_TABLET | Freq: Every day | ORAL | 11 refills | 0.00000 days | Status: CP
Start: 2016-12-09 — End: 2017-05-22

## 2016-12-09 MED ORDER — PREDNISONE 5 MG TABLET: 5 mg | tablet | Freq: Every day | 11 refills | 0 days | Status: AC

## 2016-12-12 ENCOUNTER — Other Ambulatory Visit (INDEPENDENT_AMBULATORY_CARE_PROVIDER_SITE_OTHER): Payer: Medicare HMO

## 2016-12-12 ENCOUNTER — Ambulatory Visit (INDEPENDENT_AMBULATORY_CARE_PROVIDER_SITE_OTHER): Payer: Medicare HMO | Admitting: Internal Medicine

## 2016-12-12 ENCOUNTER — Encounter: Payer: Self-pay | Admitting: Internal Medicine

## 2016-12-12 DIAGNOSIS — R300 Dysuria: Secondary | ICD-10-CM

## 2016-12-12 LAB — URINALYSIS, ROUTINE W REFLEX MICROSCOPIC
BILIRUBIN URINE: NEGATIVE
KETONES UR: NEGATIVE
NITRITE: NEGATIVE
Specific Gravity, Urine: 1.025 (ref 1.000–1.030)
TOTAL PROTEIN, URINE-UPE24: 100 — AB
Urine Glucose: NEGATIVE
Urobilinogen, UA: 2 — AB (ref 0.0–1.0)
pH: 6.5 (ref 5.0–8.0)

## 2016-12-12 MED ORDER — CEPHALEXIN 500 MG PO CAPS: 500.0000 mg | ORAL_CAPSULE | Freq: Three times a day (TID) | ORAL | 0 refills | Status: AC

## 2016-12-12 MED ORDER — CEPHALEXIN 500 MG PO CAPS: 500.0000 mg | ORAL_CAPSULE | Freq: Three times a day (TID) | ORAL | 0 refills | Status: DC

## 2016-12-12 NOTE — Progress Notes (Signed)
Subjective:    Patient ID: [name] [name], male    DOB: 08/30/1940, 76 y.o.   MRN: [id number]  HPI  76yo M with hx of met prostate ca on zytiga and prednisone, with LUTS requiring flomax and self caths daily, here with c/o 10 days onset but particularly worse 3 days dysuria that now lasts for about 1 hour after the urination.  Denies urinary symptoms such as frequency, urgency, flank pain, hematuria or n/v, fever, chills. Does have mild loose bowels associated with low mid abd discomfort, but Denies worsening reflux, other abd pain, dysphagia, other bowel change or blood. No prior hx of UTI per pt Past Medical History:  Diagnosis Date  . BACKACHE NOS   . Carbuncle and furuncle of trunk   . COPD (chronic obstructive pulmonary disease) ([place])   . GERD (gastroesophageal reflux disease)   . Prostate cancer ([place])   . Right rib fracture 05/2012   traumatic, min displaced on cxr  . Scoliosis   . Sebaceous cyst    Past Surgical History:  Procedure Laterality Date  . EXCISIONAL HEMORRHOIDECTOMY    . TONSILLECTOMY      reports that he quit smoking about 41 years ago. His smoking use included Cigarettes. He has a 45.00 pack-year smoking history. He has never used smokeless tobacco. He reports that he drinks alcohol. He reports that he does not use drugs. family history includes Alzheimer's disease in his mother; Cirrhosis in his paternal grandfather; Diabetes in his father and mother. Allergies  Allergen Reactions  . Latex Itching and Rash   Current Outpatient Prescriptions on File Prior to Visit  Medication Sig Dispense Refill  . abiraterone Acetate (ZYTIGA) 250 MG tablet Take 1,000 mg by mouth.    Marland Kitchen acetaminophen (TYLENOL) 325 MG tablet Take 650 mg by mouth.    . Ascorbic Acid (VITAMIN C WITH ROSE HIPS) 1000 MG tablet Take 1,000 mg by mouth daily.      Marland Kitchen aspirin 81 MG tablet Take 81 mg by mouth every other day.     Marland Kitchen azithromycin (ZITHROMAX Z-PAK) 250 MG tablet As directed 6 tablet 0  .  bicalutamide (CASODEX) 50 MG tablet Take 50 mg by mouth.    . Calcium Carbonate-Vit D-Min (CALCIUM 600+D PLUS MINERALS) 600-400 MG-UNIT TABS Take by mouth.    . Calcium Carbonate-Vitamin D (CALCIUM 600-D) 600-400 MG-UNIT per tablet Take 1 tablet by mouth daily.      . Cholecalciferol (VITAMIN D3) 5000 units CAPS Take by mouth daily.    . clarithromycin (BIAXIN) 500 MG tablet Take 1 tablet (500 mg total) by mouth 2 (two) times daily. 28 tablet 0  . Coenzyme Q10 (COQ10 PO) Take by mouth daily.    Marland Kitchen ibuprofen (ADVIL,MOTRIN) 800 MG tablet Take 1 tablet (800 mg total) by mouth every 8 (eight) hours as needed. 90 tablet 1  . metroNIDAZOLE (FLAGYL) 500 MG tablet Take 1 tablet (500 mg total) by mouth 2 (two) times daily. 28 tablet 0  . Multiple Vitamins-Minerals (EYE VITAMINS & MINERALS) TABS Take by mouth.    . Multiple Vitamins-Minerals (MENS MULTIVITAMIN PLUS PO) Take by mouth daily.      Marland Kitchen omeprazole (PRILOSEC) 20 MG capsule Take 1 capsule (20 mg total) by mouth 2 (two) times daily before a meal. 28 capsule 0  . predniSONE (DELTASONE) 5 MG tablet Take 5 mg by mouth.    . senna (SENOKOT) 8.6 MG tablet Take by mouth.    . traMADol (ULTRAM) 50 MG tablet  Take 25 mg by mouth.    . vitamin B-12 (CYANOCOBALAMIN) 1000 MCG tablet Take 1,000 mcg by mouth every other day.     . vitamin E (VITAMIN E) 400 UNIT capsule Take 400 Units by mouth every other day.      No current facility-administered medications on file prior to visit.    Review of Systems  All other system neg per pt    Objective:   Physical Exam BP 114/76   Pulse 82   Temp 98.4 F (36.9 C) (Oral)   Ht 5' 6" (1.676 m)   Wt 150 lb (68 kg)   SpO2 100%   BMI 24.21 kg/m  VS noted, mild ill but very pleasant Constitutional: Pt appears in NAD HENT: Head: NCAT.  Right Ear: External ear normal.  Left Ear: External ear normal.  Eyes: . Pupils are equal, round, and reactive to light. Conjunctivae and EOM are normal Nose: without d/c or  deformity Neck: Neck supple. Gross normal ROM Cardiovascular: Normal rate and regular rhythm.   Pulmonary/Chest: Effort normal and breath sounds without rales or wheezing.  Abd:  Soft, ND, + BS, no organomegaly with mild low mid abd tender without guarding or rebound Neurological: Pt is alert. At baseline orientation, motor grossly intact Skin: Skin is warm. No rashes, other new lesions, no LE edema Psychiatric: Pt behavior is normal without agitation  No other exam findings    Assessment & Plan:   

## 2016-12-12 NOTE — Assessment & Plan Note (Signed)
No prior hx but high suspicion for acute cystitis, for Urine studies and empiric antibx,  to f/u any worsening symptoms or concerns

## 2016-12-12 NOTE — Patient Instructions (Signed)
Please take all new medication as prescribed - the antibiotic  Please continue all other medications as before, and refills have been done if requested.  Please have the pharmacy call with any other refills you may need.  Please keep your appointments with your specialists as you may have planned  Please go to the LAB in the Basement (turn left off the elevator) for the tests to be done today  You will be contacted by phone if any changes need to be made immediately.  Otherwise, you will receive a letter about your results with an explanation, but please check with MyChart first.  Please remember to sign up for MyChart if you have not done so, as this will be important to you in the future with finding out test results, communicating by private email, and scheduling acute appointments online when needed.   

## 2016-12-15 LAB — URINE CULTURE
MICRO NUMBER:: 81140250
SPECIMEN QUALITY:: ADEQUATE

## 2016-12-16 DIAGNOSIS — H25043 Posterior subcapsular polar age-related cataract, bilateral: Secondary | ICD-10-CM | POA: Diagnosis not present

## 2016-12-16 DIAGNOSIS — H35372 Puckering of macula, left eye: Secondary | ICD-10-CM | POA: Diagnosis not present

## 2016-12-16 DIAGNOSIS — H18413 Arcus senilis, bilateral: Secondary | ICD-10-CM | POA: Diagnosis not present

## 2016-12-16 DIAGNOSIS — H25013 Cortical age-related cataract, bilateral: Secondary | ICD-10-CM | POA: Diagnosis not present

## 2016-12-16 DIAGNOSIS — H2512 Age-related nuclear cataract, left eye: Secondary | ICD-10-CM | POA: Diagnosis not present

## 2016-12-16 DIAGNOSIS — H2513 Age-related nuclear cataract, bilateral: Secondary | ICD-10-CM | POA: Diagnosis not present

## 2016-12-22 ENCOUNTER — Telehealth: Payer: Self-pay | Admitting: Internal Medicine

## 2016-12-22 DIAGNOSIS — R300 Dysuria: Secondary | ICD-10-CM

## 2016-12-22 NOTE — Addendum Note (Signed)
Addended by: Pricilla Holm A on: 12/22/2016 04:30 PM   Modules accepted: Orders

## 2016-12-22 NOTE — Telephone Encounter (Signed)
Called patient and notified wife that orders were put in for a urine sample and all he had to do was go to the lab whenever he had time. Wife stated understanding.

## 2016-12-22 NOTE — Telephone Encounter (Signed)
Needs urine sample to check if he still has infection.

## 2016-12-22 NOTE — Telephone Encounter (Signed)
Patient states he was seen by Dr. Jenny Reichmann for a UTI.  States he has finished up his antibiotic today.  States he still has bad burning sensation when urination.  Would like to know if Dr. Sharlet Salina could send something else to his pharmacy [facility] on [address] - this also needs to be updated on his pharmacy list)?

## 2016-12-23 ENCOUNTER — Other Ambulatory Visit (INDEPENDENT_AMBULATORY_CARE_PROVIDER_SITE_OTHER): Payer: Medicare HMO

## 2016-12-23 ENCOUNTER — Other Ambulatory Visit: Payer: Self-pay | Admitting: Internal Medicine

## 2016-12-23 DIAGNOSIS — R300 Dysuria: Secondary | ICD-10-CM | POA: Diagnosis not present

## 2016-12-23 LAB — URINALYSIS, ROUTINE W REFLEX MICROSCOPIC
Bilirubin Urine: NEGATIVE
Ketones, ur: NEGATIVE
Nitrite: NEGATIVE
PH: 7 (ref 5.0–8.0)
SPECIFIC GRAVITY, URINE: 1.02 (ref 1.000–1.030)
TOTAL PROTEIN, URINE-UPE24: 100 — AB
UROBILINOGEN UA: 0.2 (ref 0.0–1.0)
Urine Glucose: NEGATIVE

## 2016-12-23 MED ORDER — SULFAMETHOXAZOLE-TRIMETHOPRIM 800-160 MG PO TABS: 1.0000 | ORAL_TABLET | Freq: Two times a day (BID) | ORAL | 0 refills | Status: DC

## 2016-12-24 LAB — URINE CULTURE
MICRO NUMBER:: 81184229
Result:: NO GROWTH
SPECIMEN QUALITY:: ADEQUATE

## 2016-12-25 ENCOUNTER — Encounter: Payer: Self-pay | Admitting: Internal Medicine

## 2016-12-25 ENCOUNTER — Ambulatory Visit
Admission: RE | Admit: 2016-12-25 | Discharge: 2016-12-25 | Disposition: A | Discharge status: Home / Self Care | Payer: MEDICARE | Attending: Geriatric Medicine | Admitting: Geriatric Medicine

## 2016-12-25 DIAGNOSIS — N390 Urinary tract infection, site not specified: Secondary | ICD-10-CM

## 2016-12-25 DIAGNOSIS — C7989 Secondary malignant neoplasm of other specified sites: Secondary | ICD-10-CM

## 2016-12-25 DIAGNOSIS — C61 Malignant neoplasm of prostate: Principal | ICD-10-CM

## 2016-12-25 DIAGNOSIS — J449 Chronic obstructive pulmonary disease, unspecified: Secondary | ICD-10-CM

## 2016-12-25 DIAGNOSIS — K219 Gastro-esophageal reflux disease without esophagitis: Secondary | ICD-10-CM

## 2016-12-25 DIAGNOSIS — C7951 Secondary malignant neoplasm of bone: Secondary | ICD-10-CM | POA: Diagnosis not present

## 2016-12-25 DIAGNOSIS — R609 Edema, unspecified: Secondary | ICD-10-CM | POA: Diagnosis not present

## 2016-12-25 DIAGNOSIS — Z23 Encounter for immunization: Secondary | ICD-10-CM | POA: Diagnosis not present

## 2016-12-25 DIAGNOSIS — Z923 Personal history of irradiation: Secondary | ICD-10-CM | POA: Diagnosis not present

## 2016-12-25 MED ORDER — PHENAZOPYRIDINE 100 MG TABLET
ORAL_TABLET | Freq: Three times a day (TID) | ORAL | 0 refills | 0 days | Status: CP | PRN
Start: 2016-12-25 — End: 2017-01-01

## 2016-12-25 MED ORDER — TAMSULOSIN 0.4 MG CAPSULE
ORAL_CAPSULE | Freq: Every day | ORAL | 3 refills | 0 days | Status: CP
Start: 2016-12-25 — End: 2017-03-26

## 2016-12-25 MED ORDER — OMEPRAZOLE 20 MG CAPSULE,DELAYED RELEASE
ORAL_CAPSULE | Freq: Every day | ORAL | 1 refills | 0 days | Status: CP
Start: 2016-12-25 — End: 2017-02-22

## 2017-01-01 ENCOUNTER — Ambulatory Visit (INDEPENDENT_AMBULATORY_CARE_PROVIDER_SITE_OTHER)
Admission: RE | Admit: 2017-01-01 | Discharge: 2017-01-01 | Disposition: A | Discharge status: Home / Self Care | Payer: Medicare HMO | Source: Ambulatory Visit | Attending: Nurse Practitioner | Admitting: Nurse Practitioner

## 2017-01-01 ENCOUNTER — Ambulatory Visit (INDEPENDENT_AMBULATORY_CARE_PROVIDER_SITE_OTHER): Payer: Medicare HMO | Admitting: Nurse Practitioner

## 2017-01-01 ENCOUNTER — Other Ambulatory Visit: Payer: Medicare HMO

## 2017-01-01 ENCOUNTER — Encounter: Payer: Self-pay | Admitting: Nurse Practitioner

## 2017-01-01 VITALS — BP 110/68 | HR 91 | Temp 98.0°F | Ht 66.0 in | Wt 148.0 lb

## 2017-01-01 DIAGNOSIS — R310 Gross hematuria: Secondary | ICD-10-CM | POA: Diagnosis not present

## 2017-01-01 DIAGNOSIS — R300 Dysuria: Secondary | ICD-10-CM

## 2017-01-01 DIAGNOSIS — R350 Frequency of micturition: Secondary | ICD-10-CM | POA: Diagnosis not present

## 2017-01-01 DIAGNOSIS — N390 Urinary tract infection, site not specified: Secondary | ICD-10-CM

## 2017-01-01 DIAGNOSIS — K5900 Constipation, unspecified: Secondary | ICD-10-CM

## 2017-01-01 DIAGNOSIS — R109 Unspecified abdominal pain: Secondary | ICD-10-CM | POA: Diagnosis not present

## 2017-01-01 LAB — POCT URINALYSIS DIPSTICK
NITRITE UA: POSITIVE
Protein, UA: 15
RBC UA: NEGATIVE
Spec Grav, UA: 1.02 (ref 1.010–1.025)
UROBILINOGEN UA: 2 U/dL — AB
pH, UA: 6 (ref 5.0–8.0)

## 2017-01-01 MED ORDER — CIPROFLOXACIN HCL 500 MG PO TABS: 500.0000 mg | ORAL_TABLET | Freq: Two times a day (BID) | ORAL | 0 refills | Status: DC

## 2017-01-01 MED ORDER — PHENAZOPYRIDINE HCL 100 MG PO TABS: 100.0000 mg | ORAL_TABLET | Freq: Three times a day (TID) | ORAL | 0 refills | Status: DC | PRN

## 2017-01-01 MED ORDER — LACTULOSE 10 GM/15ML PO SOLN: 10.0000 g | Freq: Two times a day (BID) | ORAL | 0 refills | Status: DC | PRN

## 2017-01-01 MED ORDER — TRAMADOL 50 MG TABLET
ORAL_TABLET | Freq: Three times a day (TID) | ORAL | 5 refills | 0.00000 days | Status: CP
Start: 2017-01-01 — End: 2017-03-12

## 2017-01-01 MED ORDER — PHENAZOPYRIDINE 100 MG TABLET
ORAL_TABLET | 0 refills | 0 days | Status: CP
Start: 2017-01-01 — End: 2017-03-12

## 2017-01-01 NOTE — Progress Notes (Signed)
Subjective:  Patient ID: [name] [name], male    DOB: 1940-03-19  Age: 76 y.o. MRN: [id number]  CC: Urinary Tract Infection (burning,frequent urinate-hot flashes-going on for 3 wks. treating for prostat cancer./ saw 2 doc for this not better. )   Dysuria   This is a recurrent problem. The current episode started 1 to 4 weeks ago. The problem has been waxing and waning. The quality of the pain is described as burning. The pain is severe. There has been no fever. He is not sexually active. There is no history of pyelonephritis. Associated symptoms include frequency, hesitancy and urgency. Pertinent negatives include no chills, discharge, flank pain, hematuria, nausea or sweats. He has tried antibiotics for the symptoms. The treatment provided moderate relief. His past medical history is significant for catheterization, recurrent UTIs, urinary stasis and a urological procedure. There is no history of kidney stones.    denies any retention. He performed self cath post urination 12/25/2016. Had 179ml of urine in bladder post cath. Has chronic intermittent constipation and diarrhea.  Outpatient Medications Prior to Visit  Medication Sig Dispense Refill  . abiraterone Acetate (ZYTIGA) 250 MG tablet Take 1,000 mg by mouth.    Marland Kitchen acetaminophen (TYLENOL) 325 MG tablet Take 650 mg by mouth.    . Ascorbic Acid (VITAMIN C WITH ROSE HIPS) 1000 MG tablet Take 1,000 mg by mouth daily.      Marland Kitchen aspirin 81 MG tablet Take 81 mg by mouth every other day.     . bicalutamide (CASODEX) 50 MG tablet Take 50 mg by mouth.    . Calcium Carbonate-Vit D-Min (CALCIUM 600+D PLUS MINERALS) 600-400 MG-UNIT TABS Take by mouth.    . Calcium Carbonate-Vitamin D (CALCIUM 600-D) 600-400 MG-UNIT per tablet Take 1 tablet by mouth daily.      . Cholecalciferol (VITAMIN D3) 5000 units CAPS Take by mouth daily.    . Coenzyme Q10 (COQ10 PO) Take by mouth daily.    Marland Kitchen ibuprofen (ADVIL,MOTRIN) 800 MG tablet Take 1 tablet (800 mg total) by  mouth every 8 (eight) hours as needed. 90 tablet 1  . Multiple Vitamins-Minerals (EYE VITAMINS & MINERALS) TABS Take by mouth.    . Multiple Vitamins-Minerals (MENS MULTIVITAMIN PLUS PO) Take by mouth daily.      Marland Kitchen omeprazole (PRILOSEC) 20 MG capsule Take 1 capsule (20 mg total) by mouth 2 (two) times daily before a meal. 28 capsule 0  . predniSONE (DELTASONE) 5 MG tablet Take 5 mg by mouth.    . senna (SENOKOT) 8.6 MG tablet Take by mouth.    . traMADol (ULTRAM) 50 MG tablet Take 25 mg by mouth.    . vitamin B-12 (CYANOCOBALAMIN) 1000 MCG tablet Take 1,000 mcg by mouth every other day.     . vitamin E (VITAMIN E) 400 UNIT capsule Take 400 Units by mouth every other day.     . metroNIDAZOLE (FLAGYL) 500 MG tablet Take 1 tablet (500 mg total) by mouth 2 (two) times daily. 28 tablet 0  . sulfamethoxazole-trimethoprim (BACTRIM DS,SEPTRA DS) 800-160 MG tablet Take 1 tablet by mouth 2 (two) times daily. 14 tablet 0  . azithromycin (ZITHROMAX Z-PAK) 250 MG tablet As directed (Patient not taking: Reported on 01/01/2017) 6 tablet 0  . clarithromycin (BIAXIN) 500 MG tablet Take 1 tablet (500 mg total) by mouth 2 (two) times daily. (Patient not taking: Reported on 01/01/2017) 28 tablet 0   No facility-administered medications prior to visit.     ROS See  HPI  Objective:  BP 110/68   Pulse 91   Temp 98 F (36.7 C)   Ht 5\' 6"  (1.676 m)   Wt 148 lb (67.1 kg)   SpO2 95%   BMI 23.89 kg/m   BP Readings from Last 3 Encounters:  01/01/17 110/68  12/12/16 114/76  08/20/16 122/82    Wt Readings from Last 3 Encounters:  01/01/17 148 lb (67.1 kg)  12/12/16 150 lb (68 kg)  08/20/16 138 lb 6.4 oz (62.8 kg)    Physical Exam  Constitutional: He is oriented to person, place, and time. No distress.  Cardiovascular: Normal rate.   Pulmonary/Chest: Effort normal.  Abdominal: Soft. He exhibits no distension.  No CVA tenderness  Musculoskeletal: Normal range of motion.  Neurological: He is alert and  oriented to person, place, and time.  Vitals reviewed.   Lab Results  Component Value Date   WBC 5.8 10/12/2015   HGB 14.9 10/12/2015   HCT 43.7 10/12/2015   PLT 234.0 10/12/2015   GLUCOSE 94 10/12/2015   CHOL 174 11/28/2014   TRIG 112.0 11/28/2014   HDL 55.80 11/28/2014   LDLCALC 95 11/28/2014   ALT 10 10/12/2015   AST 17 10/12/2015   NA 132 (L) 10/12/2015   K 4.4 10/12/2015   CL 96 10/12/2015   CREATININE 0.83 10/12/2015   BUN 14 10/12/2015   CO2 32 10/12/2015   TSH 1.42 12/29/2012   PSA 1.23 11/28/2014    Assessment & Plan:   [name] [name] was seen today for urinary tract infection.  Diagnoses and all orders for this visit:  Complicated UTI (urinary tract infection) -     POCT urinalysis dipstick -     Urine Culture; Future -     DG Abd 1 View; Future -     phenazopyridine (PYRIDIUM) 100 MG tablet; Take 1 tablet (100 mg total) by mouth 3 (three) times daily as needed for pain. -     ciprofloxacin (CIPRO) 500 MG tablet; Take 1 tablet (500 mg total) by mouth 2 (two) times daily.  Frequent urination -     POCT urinalysis dipstick -     Urine Culture; Future  Gross hematuria -     DG Abd 1 View; Future  Constipation, unspecified constipation type -     lactulose (CHRONULAC) 10 GM/15ML solution; Take 15 mLs (10 g total) by mouth 2 (two) times daily as needed for mild constipation or severe constipation.   I have discontinued [name] [name]'s azithromycin, clarithromycin, metroNIDAZOLE, and sulfamethoxazole-trimethoprim. I have also changed his phenazopyridine. Additionally, I am having him start on ciprofloxacin and lactulose. Lastly, I am having him maintain his Multiple Vitamins-Minerals (MENS MULTIVITAMIN PLUS PO), Calcium Carbonate-Vitamin D, vitamin C with rose hips, vitamin E, vitamin B-12, aspirin, Vitamin D3, Coenzyme Q10 (COQ10 PO), omeprazole, abiraterone Acetate, acetaminophen, bicalutamide, CALCIUM 600+D PLUS MINERALS, predniSONE, senna, traMADol, EYE VITAMINS &  MINERALS, and ibuprofen.  Meds ordered this encounter  Medications  . DISCONTD: phenazopyridine (PYRIDIUM) 100 MG tablet    Sig: TAKE 1 TABLET BY MOUTH THREE TIMES DAILY AS NEEDED FOR URETHRAL PAIN  . phenazopyridine (PYRIDIUM) 100 MG tablet    Sig: Take 1 tablet (100 mg total) by mouth 3 (three) times daily as needed for pain.    Dispense:  10 tablet    Refill:  0    Order Specific Question:   Supervising Provider    Answer:   Cassandria Anger [[id number]]  . ciprofloxacin (CIPRO) 500 MG tablet  Sig: Take 1 tablet (500 mg total) by mouth 2 (two) times daily.    Dispense:  14 tablet    Refill:  0    Order Specific Question:   Supervising Provider    Answer:   Cassandria Anger [[id number]]  . lactulose (CHRONULAC) 10 GM/15ML solution    Sig: Take 15 mLs (10 g total) by mouth 2 (two) times daily as needed for mild constipation or severe constipation.    Dispense:  240 mL    Refill:  0    Order Specific Question:   Supervising Provider    Answer:   Cassandria Anger [[id number]]    Follow-up: Return if symptoms worsen or fail to improve.  Wilfred Lacy, NP

## 2017-01-01 NOTE — Patient Instructions (Addendum)
Encourage adequate oral hydration.  X-ray indicates stool in colon, hence unable to visualize kidney, ureter and bladder. Sent lactulose prescription for constipation. Follow up with [facility] urology ASAP. You will be contacted with urine culture results.

## 2017-01-02 LAB — URINE CULTURE
MICRO NUMBER: 81227536
Result:: NO GROWTH
SPECIMEN QUALITY: ADEQUATE

## 2017-01-12 ENCOUNTER — Ambulatory Visit
Admission: RE | Admit: 2017-01-12 | Discharge: 2017-01-30 | Disposition: A | Discharge status: Home / Self Care | Payer: MEDICARE | Attending: Radiation Oncology | Admitting: Radiation Oncology

## 2017-01-12 DIAGNOSIS — C61 Malignant neoplasm of prostate: Principal | ICD-10-CM

## 2017-01-12 DIAGNOSIS — C7989 Secondary malignant neoplasm of other specified sites: Secondary | ICD-10-CM

## 2017-01-12 DIAGNOSIS — C7951 Secondary malignant neoplasm of bone: Secondary | ICD-10-CM | POA: Diagnosis not present

## 2017-01-12 DIAGNOSIS — Z510 Encounter for antineoplastic radiation therapy: Secondary | ICD-10-CM | POA: Diagnosis not present

## 2017-01-12 DIAGNOSIS — N139 Obstructive and reflux uropathy, unspecified: Secondary | ICD-10-CM | POA: Diagnosis not present

## 2017-01-12 DIAGNOSIS — R339 Retention of urine, unspecified: Secondary | ICD-10-CM | POA: Diagnosis not present

## 2017-01-12 DIAGNOSIS — R300 Dysuria: Secondary | ICD-10-CM | POA: Diagnosis not present

## 2017-02-23 MED ORDER — OMEPRAZOLE 20 MG CAPSULE,DELAYED RELEASE
ORAL_CAPSULE | 1 refills | 0 days | Status: CP
Start: 2017-02-23 — End: 2017-04-14

## 2017-03-09 ENCOUNTER — Other Ambulatory Visit: Admit: 2017-03-09 | Discharge: 2017-03-22 | Payer: MEDICARE

## 2017-03-09 ENCOUNTER — Ambulatory Visit: Admit: 2017-03-09 | Discharge: 2017-03-22 | Payer: MEDICARE

## 2017-03-09 DIAGNOSIS — C7989 Secondary malignant neoplasm of other specified sites: Secondary | ICD-10-CM

## 2017-03-09 DIAGNOSIS — C61 Malignant neoplasm of prostate: Principal | ICD-10-CM

## 2017-03-09 DIAGNOSIS — R9341 Abnormal radiologic findings on diagnostic imaging of renal pelvis, ureter, or bladder: Secondary | ICD-10-CM | POA: Diagnosis not present

## 2017-03-09 DIAGNOSIS — M8458XA Pathological fracture in neoplastic disease, other specified site, initial encounter for fracture: Secondary | ICD-10-CM | POA: Diagnosis not present

## 2017-03-09 DIAGNOSIS — C7951 Secondary malignant neoplasm of bone: Secondary | ICD-10-CM | POA: Diagnosis not present

## 2017-03-09 DIAGNOSIS — N210 Calculus in bladder: Secondary | ICD-10-CM | POA: Diagnosis not present

## 2017-03-09 DIAGNOSIS — N3289 Other specified disorders of bladder: Secondary | ICD-10-CM | POA: Diagnosis not present

## 2017-03-09 DIAGNOSIS — R590 Localized enlarged lymph nodes: Secondary | ICD-10-CM | POA: Diagnosis not present

## 2017-03-12 ENCOUNTER — Ambulatory Visit: Admit: 2017-03-12 | Discharge: 2017-03-12 | Payer: MEDICARE

## 2017-03-12 DIAGNOSIS — C7989 Secondary malignant neoplasm of other specified sites: Secondary | ICD-10-CM

## 2017-03-12 DIAGNOSIS — C61 Malignant neoplasm of prostate: Principal | ICD-10-CM

## 2017-03-12 DIAGNOSIS — M79604 Pain in right leg: Secondary | ICD-10-CM | POA: Diagnosis not present

## 2017-03-12 DIAGNOSIS — Z5112 Encounter for antineoplastic immunotherapy: Secondary | ICD-10-CM | POA: Diagnosis not present

## 2017-03-12 DIAGNOSIS — M79605 Pain in left leg: Secondary | ICD-10-CM | POA: Diagnosis not present

## 2017-03-12 DIAGNOSIS — M549 Dorsalgia, unspecified: Secondary | ICD-10-CM | POA: Diagnosis not present

## 2017-03-12 DIAGNOSIS — Z5181 Encounter for therapeutic drug level monitoring: Secondary | ICD-10-CM | POA: Diagnosis not present

## 2017-03-20 ENCOUNTER — Encounter: Payer: Self-pay | Admitting: Internal Medicine

## 2017-03-20 ENCOUNTER — Ambulatory Visit (INDEPENDENT_AMBULATORY_CARE_PROVIDER_SITE_OTHER): Payer: Medicare HMO | Admitting: Internal Medicine

## 2017-03-20 VITALS — BP 120/70 | HR 85 | Temp 97.5°F | Ht 66.0 in | Wt 148.0 lb

## 2017-03-20 DIAGNOSIS — M4120 Other idiopathic scoliosis, site unspecified: Secondary | ICD-10-CM | POA: Diagnosis not present

## 2017-03-20 NOTE — Assessment & Plan Note (Signed)
Referral to orthopedics for evaluation if deep tissue massage is safe per patient request.

## 2017-03-20 NOTE — Patient Instructions (Addendum)
We will get you in with Dr. Jacelyn Grip for the back.

## 2017-03-20 NOTE — Progress Notes (Signed)
   Subjective:    Patient ID: [name] [name], male    DOB: 1940-11-14, 77 y.o.   MRN: [id number]  HPI The patient is a 77 YO man coming in for scoliosis. He is currently under treatment for metastatic prostate cancer with hormone therapy and taking prednisone with zytiga. He is concerned that the prednisone can cause bone loss. They are ordering a bone density scan and he is waiting on that. He is worried because previous to the cancer he was managing his pain in his back with deep tissue massage and a gravity table at home. He had to stop this during cancer treatment and is concerned about the stability of his spine. A recent bone scan showed some problems with the vertebrae in the back (and he has metastatic bone lesions). Currently is taking ibuprofen and rare tramadol for pain. Wants to see Dr Jacelyn Grip for orthopedics.  Review of Systems  Constitutional: Negative.   Respiratory: Negative for cough, chest tightness and shortness of breath.   Cardiovascular: Negative for chest pain, palpitations and leg swelling.  Gastrointestinal: Negative for abdominal distention, abdominal pain, constipation, diarrhea, nausea and vomiting.  Musculoskeletal: Positive for arthralgias and back pain.  Skin: Negative.   Neurological: Negative.   Psychiatric/Behavioral: Negative.       Objective:   Physical Exam  Constitutional: He is oriented to person, place, and time. He appears well-developed and well-nourished.  HENT:  Head: Normocephalic and atraumatic.  Eyes: EOM are normal.  Neck: Normal range of motion.  Cardiovascular: Normal rate and regular rhythm.  Pulmonary/Chest: Effort normal and breath sounds normal. No respiratory distress. He has no wheezes. He has no rales.  Abdominal: Soft.  Musculoskeletal: He exhibits tenderness. He exhibits no edema.  Neurological: He is alert and oriented to person, place, and time. Coordination normal.  Skin: Skin is warm and dry.   Vitals:   03/20/17 1105  BP:  120/70  Pulse: 85  Temp: (!) 97.5 F (36.4 C)  TempSrc: Oral  SpO2: 99%  Weight: 148 lb (67.1 kg)  Height: 5\' 6"  (1.676 m)      Assessment & Plan:

## 2017-03-25 ENCOUNTER — Other Ambulatory Visit: Payer: Self-pay | Admitting: Family

## 2017-03-26 ENCOUNTER — Ambulatory Visit: Admit: 2017-03-26 | Discharge: 2017-03-26 | Payer: MEDICARE

## 2017-03-26 ENCOUNTER — Ambulatory Visit
Admit: 2017-03-26 | Discharge: 2017-03-26 | Payer: MEDICARE | Attending: Geriatric Medicine | Primary: Geriatric Medicine

## 2017-03-26 DIAGNOSIS — C61 Malignant neoplasm of prostate: Principal | ICD-10-CM

## 2017-03-26 DIAGNOSIS — M4850XD Collapsed vertebra, not elsewhere classified, site unspecified, subsequent encounter for fracture with routine healing: Secondary | ICD-10-CM

## 2017-03-26 DIAGNOSIS — Z7952 Long term (current) use of systemic steroids: Principal | ICD-10-CM

## 2017-03-26 DIAGNOSIS — C7951 Secondary malignant neoplasm of bone: Secondary | ICD-10-CM

## 2017-03-26 DIAGNOSIS — M79661 Pain in right lower leg: Secondary | ICD-10-CM | POA: Diagnosis not present

## 2017-03-26 DIAGNOSIS — M419 Scoliosis, unspecified: Secondary | ICD-10-CM | POA: Diagnosis not present

## 2017-03-26 DIAGNOSIS — R339 Retention of urine, unspecified: Secondary | ICD-10-CM | POA: Diagnosis not present

## 2017-03-26 DIAGNOSIS — R29898 Other symptoms and signs involving the musculoskeletal system: Secondary | ICD-10-CM | POA: Diagnosis not present

## 2017-03-26 DIAGNOSIS — M4850XA Collapsed vertebra, not elsewhere classified, site unspecified, initial encounter for fracture: Secondary | ICD-10-CM | POA: Diagnosis not present

## 2017-03-26 MED ORDER — TRAMADOL 50 MG TABLET
ORAL_TABLET | Freq: Two times a day (BID) | ORAL | 3 refills | 0 days | Status: CP | PRN
Start: 2017-03-26 — End: 2017-09-06

## 2017-03-26 MED ORDER — TAMSULOSIN 0.4 MG CAPSULE
ORAL_CAPSULE | Freq: Every day | ORAL | 3 refills | 0 days
Start: 2017-03-26 — End: 2017-05-14

## 2017-03-30 DIAGNOSIS — M545 Low back pain: Secondary | ICD-10-CM | POA: Diagnosis not present

## 2017-04-01 MED ORDER — IBUPROFEN 800 MG TABLET
ORAL_TABLET | Freq: Two times a day (BID) | ORAL | 0 refills | 0.00000 days | Status: CP | PRN
Start: 2017-04-01 — End: 2017-07-09

## 2017-04-03 DIAGNOSIS — M545 Low back pain: Secondary | ICD-10-CM | POA: Diagnosis not present

## 2017-04-06 DIAGNOSIS — H2512 Age-related nuclear cataract, left eye: Secondary | ICD-10-CM | POA: Diagnosis not present

## 2017-04-07 DIAGNOSIS — H2511 Age-related nuclear cataract, right eye: Secondary | ICD-10-CM | POA: Diagnosis not present

## 2017-04-13 DIAGNOSIS — M545 Low back pain: Secondary | ICD-10-CM | POA: Diagnosis not present

## 2017-04-15 MED ORDER — OMEPRAZOLE 20 MG CAPSULE,DELAYED RELEASE
ORAL_CAPSULE | 1 refills | 0 days | Status: CP
Start: 2017-04-15 — End: 2017-05-13

## 2017-04-27 DIAGNOSIS — H2511 Age-related nuclear cataract, right eye: Secondary | ICD-10-CM | POA: Diagnosis not present

## 2017-05-13 MED ORDER — OMEPRAZOLE 20 MG CAPSULE,DELAYED RELEASE
ORAL_CAPSULE | Freq: Every day | ORAL | 3 refills | 0 days | Status: CP
Start: 2017-05-13 — End: 2018-05-13

## 2017-05-14 MED ORDER — TAMSULOSIN 0.4 MG CAPSULE
ORAL_CAPSULE | Freq: Every day | ORAL | 3 refills | 0.00000 days
Start: 2017-05-14 — End: 2017-05-26

## 2017-05-22 MED ORDER — PREDNISONE 5 MG TABLET: 5 mg | tablet | Freq: Every day | 11 refills | 0 days | Status: AC

## 2017-05-22 MED ORDER — PREDNISONE 5 MG TABLET
ORAL_TABLET | Freq: Every day | ORAL | 11 refills | 0.00000 days | Status: CP
Start: 2017-05-22 — End: 2017-08-07

## 2017-05-26 MED ORDER — TAMSULOSIN 0.4 MG CAPSULE
ORAL_CAPSULE | Freq: Every day | ORAL | 3 refills | 0.00000 days
Start: 2017-05-26 — End: 2017-09-14

## 2017-06-05 ENCOUNTER — Encounter (HOSPITAL_COMMUNITY): Payer: Self-pay | Admitting: Family Medicine

## 2017-06-05 ENCOUNTER — Ambulatory Visit (HOSPITAL_COMMUNITY)
Admission: EM | Admit: 2017-06-05 | Discharge: 2017-06-05 | Disposition: A | Discharge status: Home / Self Care | Payer: Medicare HMO | Attending: Family Medicine | Admitting: Family Medicine

## 2017-06-05 ENCOUNTER — Ambulatory Visit (INDEPENDENT_AMBULATORY_CARE_PROVIDER_SITE_OTHER): Payer: Medicare HMO

## 2017-06-05 DIAGNOSIS — R0781 Pleurodynia: Secondary | ICD-10-CM

## 2017-06-05 DIAGNOSIS — S299XXA Unspecified injury of thorax, initial encounter: Secondary | ICD-10-CM | POA: Diagnosis not present

## 2017-06-05 MED ORDER — CYCLOBENZAPRINE HCL 5 MG PO TABS: 5.0000 mg | ORAL_TABLET | Freq: Every day | ORAL | 0 refills | Status: DC
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
## 2017-06-11 ENCOUNTER — Ambulatory Visit: Admit: 2017-06-11 | Discharge: 2017-06-11 | Payer: MEDICARE | Attending: Internal Medicine | Primary: Internal Medicine

## 2017-06-11 ENCOUNTER — Other Ambulatory Visit: Admit: 2017-06-11 | Discharge: 2017-06-11 | Payer: MEDICARE

## 2017-06-11 DIAGNOSIS — C61 Malignant neoplasm of prostate: Principal | ICD-10-CM

## 2017-06-11 DIAGNOSIS — C7989 Secondary malignant neoplasm of other specified sites: Secondary | ICD-10-CM

## 2017-06-11 DIAGNOSIS — C7951 Secondary malignant neoplasm of bone: Secondary | ICD-10-CM

## 2017-06-11 DIAGNOSIS — M419 Scoliosis, unspecified: Secondary | ICD-10-CM | POA: Diagnosis not present

## 2017-06-11 DIAGNOSIS — C772 Secondary and unspecified malignant neoplasm of intra-abdominal lymph nodes: Secondary | ICD-10-CM | POA: Diagnosis not present

## 2017-06-11 DIAGNOSIS — M810 Age-related osteoporosis without current pathological fracture: Secondary | ICD-10-CM | POA: Diagnosis not present

## 2017-06-11 DIAGNOSIS — M791 Myalgia, unspecified site: Secondary | ICD-10-CM | POA: Diagnosis not present

## 2017-06-11 DIAGNOSIS — J449 Chronic obstructive pulmonary disease, unspecified: Secondary | ICD-10-CM | POA: Diagnosis not present

## 2017-06-11 DIAGNOSIS — Z5112 Encounter for antineoplastic immunotherapy: Secondary | ICD-10-CM | POA: Diagnosis not present

## 2017-07-09 ENCOUNTER — Ambulatory Visit
Admit: 2017-07-09 | Discharge: 2017-07-09 | Payer: MEDICARE | Attending: Geriatric Medicine | Primary: Geriatric Medicine

## 2017-07-09 DIAGNOSIS — T387X5A Adverse effect of androgens and anabolic congeners, initial encounter: Secondary | ICD-10-CM

## 2017-07-09 DIAGNOSIS — M818 Other osteoporosis without current pathological fracture: Principal | ICD-10-CM

## 2017-07-09 DIAGNOSIS — C61 Malignant neoplasm of prostate: Secondary | ICD-10-CM

## 2017-07-09 DIAGNOSIS — C7951 Secondary malignant neoplasm of bone: Secondary | ICD-10-CM

## 2017-08-07 MED ORDER — PREDNISONE 5 MG TABLET
ORAL_TABLET | Freq: Every day | ORAL | 11 refills | 0.00000 days | Status: CP
Start: 2017-08-07 — End: 2018-08-19

## 2017-09-07 ENCOUNTER — Ambulatory Visit: Admit: 2017-09-07 | Discharge: 2017-09-07 | Payer: MEDICARE | Attending: Internal Medicine | Primary: Internal Medicine

## 2017-09-07 ENCOUNTER — Ambulatory Visit: Admit: 2017-09-07 | Discharge: 2017-09-07 | Payer: MEDICARE

## 2017-09-07 ENCOUNTER — Ambulatory Visit: Admit: 2017-09-07 | Discharge: 2017-10-06 | Payer: MEDICARE

## 2017-09-07 DIAGNOSIS — C61 Malignant neoplasm of prostate: Principal | ICD-10-CM

## 2017-09-07 DIAGNOSIS — C7951 Secondary malignant neoplasm of bone: Secondary | ICD-10-CM

## 2017-09-07 DIAGNOSIS — C7949 Secondary malignant neoplasm of other parts of nervous system: Secondary | ICD-10-CM | POA: Diagnosis not present

## 2017-09-07 DIAGNOSIS — M8448XS Pathological fracture, other site, sequela: Secondary | ICD-10-CM | POA: Diagnosis not present

## 2017-09-07 MED ORDER — TRAMADOL 50 MG TABLET
ORAL_TABLET | 3 refills | 0 days | Status: CP
Start: 2017-09-07 — End: 2018-01-05

## 2017-09-14 ENCOUNTER — Other Ambulatory Visit: Admit: 2017-09-14 | Discharge: 2017-09-14 | Payer: MEDICARE

## 2017-09-14 ENCOUNTER — Ambulatory Visit: Admit: 2017-09-14 | Discharge: 2017-09-14 | Payer: MEDICARE | Attending: Internal Medicine | Primary: Internal Medicine

## 2017-09-14 DIAGNOSIS — C7951 Secondary malignant neoplasm of bone: Secondary | ICD-10-CM

## 2017-09-14 DIAGNOSIS — C61 Malignant neoplasm of prostate: Principal | ICD-10-CM

## 2017-09-14 DIAGNOSIS — C772 Secondary and unspecified malignant neoplasm of intra-abdominal lymph nodes: Secondary | ICD-10-CM | POA: Diagnosis not present

## 2017-09-14 DIAGNOSIS — K219 Gastro-esophageal reflux disease without esophagitis: Secondary | ICD-10-CM | POA: Diagnosis not present

## 2017-09-14 DIAGNOSIS — Z87891 Personal history of nicotine dependence: Secondary | ICD-10-CM | POA: Diagnosis not present

## 2017-09-14 DIAGNOSIS — J449 Chronic obstructive pulmonary disease, unspecified: Secondary | ICD-10-CM | POA: Diagnosis not present

## 2017-09-14 DIAGNOSIS — Z7952 Long term (current) use of systemic steroids: Secondary | ICD-10-CM | POA: Diagnosis not present

## 2017-09-14 DIAGNOSIS — M810 Age-related osteoporosis without current pathological fracture: Secondary | ICD-10-CM | POA: Diagnosis not present

## 2017-09-14 DIAGNOSIS — M419 Scoliosis, unspecified: Secondary | ICD-10-CM | POA: Diagnosis not present

## 2017-09-14 MED ORDER — TAMSULOSIN 0.4 MG CAPSULE
ORAL_CAPSULE | Freq: Two times a day (BID) | ORAL | 3 refills | 0 days | Status: CP
Start: 2017-09-14 — End: 2018-09-28

## 2017-10-13 ENCOUNTER — Ambulatory Visit
Admit: 2017-10-13 | Discharge: 2017-10-14 | Payer: MEDICARE | Attending: Geriatric Medicine | Primary: Geriatric Medicine

## 2017-10-13 DIAGNOSIS — T387X5A Adverse effect of androgens and anabolic congeners, initial encounter: Secondary | ICD-10-CM

## 2017-10-13 DIAGNOSIS — M818 Other osteoporosis without current pathological fracture: Secondary | ICD-10-CM

## 2017-10-13 DIAGNOSIS — C7951 Secondary malignant neoplasm of bone: Secondary | ICD-10-CM

## 2017-10-13 DIAGNOSIS — C61 Malignant neoplasm of prostate: Secondary | ICD-10-CM

## 2017-10-13 DIAGNOSIS — M4180 Other forms of scoliosis, site unspecified: Principal | ICD-10-CM

## 2017-10-13 DIAGNOSIS — R413 Other amnesia: Secondary | ICD-10-CM

## 2017-11-20 ENCOUNTER — Ambulatory Visit (INDEPENDENT_AMBULATORY_CARE_PROVIDER_SITE_OTHER): Payer: Medicare HMO | Admitting: Family

## 2017-11-20 ENCOUNTER — Encounter: Payer: Self-pay | Admitting: Family

## 2017-11-20 VITALS — BP 116/78 | HR 84 | Temp 98.6°F | Ht 66.0 in | Wt 154.2 lb

## 2017-11-20 DIAGNOSIS — L989 Disorder of the skin and subcutaneous tissue, unspecified: Secondary | ICD-10-CM | POA: Diagnosis not present

## 2017-11-20 MED ORDER — DOXYCYCLINE HYCLATE 100 MG PO TABS: 100.0000 mg | ORAL_TABLET | Freq: Two times a day (BID) | ORAL | 0 refills | Status: DC
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
## 2017-12-01 ENCOUNTER — Ambulatory Visit (INDEPENDENT_AMBULATORY_CARE_PROVIDER_SITE_OTHER): Payer: Medicare HMO

## 2017-12-01 DIAGNOSIS — H35372 Puckering of macula, left eye: Secondary | ICD-10-CM | POA: Diagnosis not present

## 2017-12-01 DIAGNOSIS — Z961 Presence of intraocular lens: Secondary | ICD-10-CM | POA: Diagnosis not present

## 2017-12-01 DIAGNOSIS — Z23 Encounter for immunization: Secondary | ICD-10-CM | POA: Diagnosis not present

## 2017-12-01 DIAGNOSIS — H18413 Arcus senilis, bilateral: Secondary | ICD-10-CM | POA: Diagnosis not present

## 2017-12-01 DIAGNOSIS — H02839 Dermatochalasis of unspecified eye, unspecified eyelid: Secondary | ICD-10-CM | POA: Diagnosis not present

## 2017-12-01 NOTE — Progress Notes (Signed)
Flu shot given today

## 2017-12-21 ENCOUNTER — Other Ambulatory Visit: Admit: 2017-12-21 | Discharge: 2017-12-27 | Payer: MEDICARE

## 2017-12-21 ENCOUNTER — Ambulatory Visit: Admit: 2017-12-21 | Discharge: 2018-01-04 | Payer: MEDICARE

## 2017-12-21 ENCOUNTER — Ambulatory Visit: Admit: 2017-12-21 | Discharge: 2017-12-27 | Payer: MEDICARE

## 2017-12-21 ENCOUNTER — Ambulatory Visit: Admit: 2017-12-21 | Discharge: 2017-12-27 | Payer: MEDICARE | Attending: Internal Medicine | Primary: Internal Medicine

## 2017-12-21 DIAGNOSIS — C61 Malignant neoplasm of prostate: Principal | ICD-10-CM

## 2017-12-21 DIAGNOSIS — C7951 Secondary malignant neoplasm of bone: Secondary | ICD-10-CM

## 2017-12-21 DIAGNOSIS — C7949 Secondary malignant neoplasm of other parts of nervous system: Secondary | ICD-10-CM | POA: Diagnosis not present

## 2017-12-21 DIAGNOSIS — M8448XS Pathological fracture, other site, sequela: Secondary | ICD-10-CM | POA: Diagnosis not present

## 2017-12-29 DIAGNOSIS — H35372 Puckering of macula, left eye: Secondary | ICD-10-CM | POA: Diagnosis not present

## 2017-12-29 DIAGNOSIS — H43813 Vitreous degeneration, bilateral: Secondary | ICD-10-CM | POA: Diagnosis not present

## 2017-12-29 DIAGNOSIS — H33322 Round hole, left eye: Secondary | ICD-10-CM | POA: Diagnosis not present

## 2017-12-29 DIAGNOSIS — H3581 Retinal edema: Secondary | ICD-10-CM | POA: Diagnosis not present

## 2017-12-30 DIAGNOSIS — D044 Carcinoma in situ of skin of scalp and neck: Secondary | ICD-10-CM | POA: Diagnosis not present

## 2018-01-05 MED ORDER — TRAMADOL 50 MG TABLET
ORAL_TABLET | 3 refills | 0 days | Status: CP
Start: 2018-01-05 — End: 2018-02-05

## 2018-01-13 ENCOUNTER — Ambulatory Visit: Admit: 2018-01-13 | Discharge: 2018-01-13 | Payer: MEDICARE | Attending: Urology | Primary: Urology

## 2018-01-13 DIAGNOSIS — R310 Gross hematuria: Secondary | ICD-10-CM

## 2018-01-13 DIAGNOSIS — C679 Malignant neoplasm of bladder, unspecified: Principal | ICD-10-CM

## 2018-01-13 DIAGNOSIS — H33322 Round hole, left eye: Secondary | ICD-10-CM | POA: Diagnosis not present

## 2018-01-13 MED ORDER — KEFLEX 500 MG CAPSULE
ORAL_CAPSULE | Freq: Two times a day (BID) | ORAL | 0 refills | 0.00000 days | Status: CP
Start: 2018-01-13 — End: 2018-01-14

## 2018-01-14 MED ORDER — CEPHALEXIN 500 MG CAPSULE
ORAL_CAPSULE | Freq: Two times a day (BID) | ORAL | 0 refills | 0.00000 days | Status: CP
Start: 2018-01-14 — End: 2018-03-09

## 2018-01-14 MED ORDER — KEFLEX 500 MG CAPSULE
ORAL_CAPSULE | Freq: Two times a day (BID) | ORAL | 0 refills | 0.00000 days | Status: CP
Start: 2018-01-14 — End: 2018-01-14

## 2018-02-01 ENCOUNTER — Ambulatory Visit: Admit: 2018-02-01 | Discharge: 2018-02-02 | Payer: MEDICARE | Attending: Internal Medicine | Primary: Internal Medicine

## 2018-02-01 ENCOUNTER — Other Ambulatory Visit: Admit: 2018-02-01 | Discharge: 2018-02-02 | Payer: MEDICARE

## 2018-02-01 DIAGNOSIS — C61 Malignant neoplasm of prostate: Principal | ICD-10-CM

## 2018-02-01 DIAGNOSIS — C7951 Secondary malignant neoplasm of bone: Secondary | ICD-10-CM

## 2018-02-05 ENCOUNTER — Ambulatory Visit
Admit: 2018-02-05 | Discharge: 2018-02-05 | Payer: MEDICARE | Attending: Geriatric Medicine | Primary: Geriatric Medicine

## 2018-02-05 DIAGNOSIS — C7951 Secondary malignant neoplasm of bone: Secondary | ICD-10-CM

## 2018-02-05 DIAGNOSIS — C61 Malignant neoplasm of prostate: Principal | ICD-10-CM

## 2018-02-05 DIAGNOSIS — T387X5A Adverse effect of androgens and anabolic congeners, initial encounter: Secondary | ICD-10-CM

## 2018-02-05 DIAGNOSIS — M818 Other osteoporosis without current pathological fracture: Secondary | ICD-10-CM

## 2018-02-05 DIAGNOSIS — M4180 Other forms of scoliosis, site unspecified: Secondary | ICD-10-CM

## 2018-02-05 DIAGNOSIS — M419 Scoliosis, unspecified: Secondary | ICD-10-CM | POA: Diagnosis not present

## 2018-02-05 DIAGNOSIS — K219 Gastro-esophageal reflux disease without esophagitis: Secondary | ICD-10-CM | POA: Diagnosis not present

## 2018-02-05 DIAGNOSIS — G893 Neoplasm related pain (acute) (chronic): Secondary | ICD-10-CM | POA: Diagnosis not present

## 2018-02-05 MED ORDER — TRAMADOL 50 MG TABLET
ORAL_TABLET | Freq: Four times a day (QID) | ORAL | 3 refills | 0 days | Status: CP | PRN
Start: 2018-02-05 — End: 2018-06-21

## 2018-02-05 MED ORDER — IBUPROFEN 800 MG TABLET
ORAL_TABLET | Freq: Two times a day (BID) | ORAL | 2 refills | 0 days | Status: CP | PRN
Start: 2018-02-05 — End: 2018-06-21

## 2018-02-10 DIAGNOSIS — H35372 Puckering of macula, left eye: Secondary | ICD-10-CM | POA: Diagnosis not present

## 2018-02-10 DIAGNOSIS — H33322 Round hole, left eye: Secondary | ICD-10-CM | POA: Diagnosis not present

## 2018-02-15 MED ORDER — TIZANIDINE 2 MG TABLET
ORAL_TABLET | Freq: Four times a day (QID) | ORAL | 0 refills | 0 days | Status: CP | PRN
Start: 2018-02-15 — End: 2018-03-29

## 2018-02-15 MED ORDER — OXYCODONE 5 MG TABLET
ORAL_TABLET | ORAL | 0 refills | 0 days | Status: CP | PRN
Start: 2018-02-15 — End: 2018-03-29

## 2018-03-09 ENCOUNTER — Ambulatory Visit: Admit: 2018-03-09 | Discharge: 2018-04-02 | Payer: MEDICARE

## 2018-03-09 ENCOUNTER — Ambulatory Visit
Admit: 2018-03-09 | Discharge: 2018-04-02 | Payer: MEDICARE | Attending: Radiation Oncology | Primary: Radiation Oncology

## 2018-03-09 ENCOUNTER — Ambulatory Visit: Admit: 2018-03-09 | Discharge: 2018-04-02 | Payer: MEDICARE | Attending: Internal Medicine | Primary: Internal Medicine

## 2018-03-09 ENCOUNTER — Ambulatory Visit: Admit: 2018-03-09 | Discharge: 2018-04-02 | Payer: MEDICARE | Attending: Children | Primary: Children

## 2018-03-09 ENCOUNTER — Other Ambulatory Visit: Admit: 2018-03-09 | Discharge: 2018-04-02 | Payer: MEDICARE

## 2018-03-09 DIAGNOSIS — C61 Malignant neoplasm of prostate: Principal | ICD-10-CM

## 2018-03-09 DIAGNOSIS — C7951 Secondary malignant neoplasm of bone: Secondary | ICD-10-CM

## 2018-03-09 DIAGNOSIS — J449 Chronic obstructive pulmonary disease, unspecified: Secondary | ICD-10-CM | POA: Diagnosis not present

## 2018-03-09 DIAGNOSIS — C772 Secondary and unspecified malignant neoplasm of intra-abdominal lymph nodes: Secondary | ICD-10-CM | POA: Diagnosis not present

## 2018-03-09 DIAGNOSIS — K219 Gastro-esophageal reflux disease without esophagitis: Secondary | ICD-10-CM | POA: Diagnosis not present

## 2018-03-09 DIAGNOSIS — M2550 Pain in unspecified joint: Secondary | ICD-10-CM | POA: Diagnosis not present

## 2018-03-09 DIAGNOSIS — M549 Dorsalgia, unspecified: Secondary | ICD-10-CM | POA: Diagnosis not present

## 2018-03-09 DIAGNOSIS — N1330 Unspecified hydronephrosis: Secondary | ICD-10-CM | POA: Diagnosis not present

## 2018-03-09 DIAGNOSIS — M810 Age-related osteoporosis without current pathological fracture: Secondary | ICD-10-CM | POA: Diagnosis not present

## 2018-03-11 ENCOUNTER — Telehealth: Payer: Self-pay | Admitting: Internal Medicine

## 2018-03-11 NOTE — Telephone Encounter (Signed)
Patient came by the office and dropped of a [employer] Request to be completed by Dr Sharlet Salina. Patient would like a call when it is ready to be picked up. [phone]). Placed in Dr Nathanial Millman box for completion.

## 2018-03-11 NOTE — Telephone Encounter (Signed)
Placed in MD folder to sign

## 2018-03-15 ENCOUNTER — Ambulatory Visit (INDEPENDENT_AMBULATORY_CARE_PROVIDER_SITE_OTHER): Payer: Medicare HMO | Admitting: Internal Medicine

## 2018-03-15 ENCOUNTER — Encounter: Payer: Self-pay | Admitting: Internal Medicine

## 2018-03-15 VITALS — BP 120/80 | HR 76 | Temp 98.9°F | Ht 66.0 in | Wt 147.0 lb

## 2018-03-15 DIAGNOSIS — C61 Malignant neoplasm of prostate: Secondary | ICD-10-CM

## 2018-03-15 DIAGNOSIS — C7951 Secondary malignant neoplasm of bone: Secondary | ICD-10-CM | POA: Diagnosis not present

## 2018-03-15 DIAGNOSIS — Z0000 Encounter for general adult medical examination without abnormal findings: Secondary | ICD-10-CM

## 2018-03-15 NOTE — Progress Notes (Signed)
   Subjective:   Patient ID: [name] [name], male    DOB: 1940/07/25, 78 y.o.   MRN: [id number]  HPI Here for medicare wellness and physical, no new complaints. Please see A/P for status and treatment of chronic medical problems.   Diet: heart healthy Physical activity: sedentary Depression/mood screen: negative Hearing: intact to whispered voice Visual acuity: grossly normal, performs annual eye exam  ADLs: capable Fall risk: none Home safety: good Cognitive evaluation: intact to orientation, naming, recall and repetition EOL planning: adv directives discussed, in place    Office Visit from 03/15/2018 in [place]  PHQ-2 Total Score  0        I have personally reviewed and have noted 1. The patient's medical and social history - reviewed today no changes 2. Their use of alcohol, tobacco or illicit drugs 3. Their current medications and supplements 4. The patient's functional ability including ADL's, fall risks, home safety risks and hearing or visual impairment. 5. Diet and physical activities 6. Evidence for depression or mood disorders 7. Care team reviewed and updated (available in snapshot)  Review of Systems  Constitutional: Negative.   HENT: Negative.   Eyes: Negative.   Respiratory: Negative for cough, chest tightness and shortness of breath.   Cardiovascular: Negative for chest pain, palpitations and leg swelling.  Gastrointestinal: Negative for abdominal distention, abdominal pain, constipation, diarrhea, nausea and vomiting.  Musculoskeletal: Positive for arthralgias, back pain and myalgias. Negative for gait problem.  Skin: Negative.   Neurological: Negative.   Psychiatric/Behavioral: Negative.     Objective:  Physical Exam Constitutional:      Appearance: He is well-developed.  HENT:     Head: Normocephalic and atraumatic.  Neck:     Musculoskeletal: Normal range of motion.  Cardiovascular:     Rate and Rhythm: Normal rate  and regular rhythm.  Pulmonary:     Effort: Pulmonary effort is normal. No respiratory distress.     Breath sounds: Normal breath sounds. No wheezing or rales.  Abdominal:     General: Bowel sounds are normal. There is no distension.     Palpations: Abdomen is soft.     Tenderness: There is no abdominal tenderness. There is no rebound.  Skin:    General: Skin is warm and dry.  Neurological:     Mental Status: He is alert and oriented to person, place, and time.     Coordination: Coordination normal.     Vitals:   03/15/18 1527  BP: 120/80  Pulse: 76  Temp: 98.9 F (37.2 C)  TempSrc: Oral  SpO2: 98%  Weight: 147 lb (66.7 kg)  Height: 5\' 6"  (1.676 m)    Assessment & Plan:

## 2018-03-16 NOTE — Assessment & Plan Note (Signed)
Flu shot up to date. Pneumonia complete. Shingrix counseled. Tetanus up to date. Colonoscopy aged out. Counseled about sun safety and mole surveillance. Counseled about the dangers of distracted driving. Given 10 year screening recommendations.  

## 2018-03-16 NOTE — Assessment & Plan Note (Signed)
Currently getting treatment and about to have radiation for 2 bone mets. Taking tramadol for chronic pain. PSA is under surveillance and is currently rising.

## 2018-03-19 ENCOUNTER — Ambulatory Visit: Admit: 2018-03-19 | Discharge: 2018-04-04 | Payer: MEDICARE

## 2018-03-19 ENCOUNTER — Ambulatory Visit: Admit: 2018-03-19 | Discharge: 2018-03-20 | Payer: MEDICARE

## 2018-03-19 DIAGNOSIS — C7951 Secondary malignant neoplasm of bone: Secondary | ICD-10-CM

## 2018-03-19 DIAGNOSIS — C61 Malignant neoplasm of prostate: Principal | ICD-10-CM

## 2018-03-20 ENCOUNTER — Ambulatory Visit: Admit: 2018-03-20 | Discharge: 2018-03-21 | Payer: MEDICARE

## 2018-03-20 DIAGNOSIS — Z452 Encounter for adjustment and management of vascular access device: Principal | ICD-10-CM

## 2018-03-23 DIAGNOSIS — C61 Malignant neoplasm of prostate: Principal | ICD-10-CM

## 2018-03-23 DIAGNOSIS — C7951 Secondary malignant neoplasm of bone: Secondary | ICD-10-CM

## 2018-03-23 DIAGNOSIS — N1330 Unspecified hydronephrosis: Secondary | ICD-10-CM | POA: Diagnosis not present

## 2018-03-23 DIAGNOSIS — M2550 Pain in unspecified joint: Secondary | ICD-10-CM | POA: Diagnosis not present

## 2018-03-23 DIAGNOSIS — C772 Secondary and unspecified malignant neoplasm of intra-abdominal lymph nodes: Secondary | ICD-10-CM | POA: Diagnosis not present

## 2018-03-23 DIAGNOSIS — J449 Chronic obstructive pulmonary disease, unspecified: Secondary | ICD-10-CM | POA: Diagnosis not present

## 2018-03-23 DIAGNOSIS — M810 Age-related osteoporosis without current pathological fracture: Secondary | ICD-10-CM | POA: Diagnosis not present

## 2018-03-23 DIAGNOSIS — K219 Gastro-esophageal reflux disease without esophagitis: Secondary | ICD-10-CM | POA: Diagnosis not present

## 2018-03-23 DIAGNOSIS — M549 Dorsalgia, unspecified: Secondary | ICD-10-CM | POA: Diagnosis not present

## 2018-03-26 DIAGNOSIS — C772 Secondary and unspecified malignant neoplasm of intra-abdominal lymph nodes: Secondary | ICD-10-CM | POA: Diagnosis not present

## 2018-03-26 DIAGNOSIS — N1330 Unspecified hydronephrosis: Secondary | ICD-10-CM | POA: Diagnosis not present

## 2018-03-26 DIAGNOSIS — J449 Chronic obstructive pulmonary disease, unspecified: Secondary | ICD-10-CM | POA: Diagnosis not present

## 2018-03-26 DIAGNOSIS — M549 Dorsalgia, unspecified: Secondary | ICD-10-CM | POA: Diagnosis not present

## 2018-03-26 DIAGNOSIS — M810 Age-related osteoporosis without current pathological fracture: Secondary | ICD-10-CM | POA: Diagnosis not present

## 2018-03-26 DIAGNOSIS — M2550 Pain in unspecified joint: Secondary | ICD-10-CM | POA: Diagnosis not present

## 2018-03-26 DIAGNOSIS — C7951 Secondary malignant neoplasm of bone: Secondary | ICD-10-CM | POA: Diagnosis not present

## 2018-03-26 DIAGNOSIS — C61 Malignant neoplasm of prostate: Secondary | ICD-10-CM | POA: Diagnosis not present

## 2018-03-26 DIAGNOSIS — K219 Gastro-esophageal reflux disease without esophagitis: Secondary | ICD-10-CM | POA: Diagnosis not present

## 2018-03-29 ENCOUNTER — Ambulatory Visit
Admit: 2018-03-29 | Discharge: 2018-03-30 | Payer: MEDICARE | Attending: Radiation Oncology | Primary: Radiation Oncology

## 2018-03-29 DIAGNOSIS — N3001 Acute cystitis with hematuria: Secondary | ICD-10-CM

## 2018-03-29 DIAGNOSIS — C7951 Secondary malignant neoplasm of bone: Secondary | ICD-10-CM

## 2018-03-29 DIAGNOSIS — C61 Malignant neoplasm of prostate: Principal | ICD-10-CM

## 2018-03-29 DIAGNOSIS — N1330 Unspecified hydronephrosis: Secondary | ICD-10-CM | POA: Diagnosis not present

## 2018-03-29 DIAGNOSIS — K219 Gastro-esophageal reflux disease without esophagitis: Secondary | ICD-10-CM | POA: Diagnosis not present

## 2018-03-29 DIAGNOSIS — M549 Dorsalgia, unspecified: Secondary | ICD-10-CM | POA: Diagnosis not present

## 2018-03-29 DIAGNOSIS — C772 Secondary and unspecified malignant neoplasm of intra-abdominal lymph nodes: Secondary | ICD-10-CM | POA: Diagnosis not present

## 2018-03-29 DIAGNOSIS — M2550 Pain in unspecified joint: Secondary | ICD-10-CM | POA: Diagnosis not present

## 2018-03-29 DIAGNOSIS — M810 Age-related osteoporosis without current pathological fracture: Secondary | ICD-10-CM | POA: Diagnosis not present

## 2018-03-29 DIAGNOSIS — J449 Chronic obstructive pulmonary disease, unspecified: Secondary | ICD-10-CM | POA: Diagnosis not present

## 2018-03-30 ENCOUNTER — Ambulatory Visit: Admit: 2018-03-30 | Discharge: 2018-03-31 | Payer: MEDICARE

## 2018-03-30 DIAGNOSIS — C7951 Secondary malignant neoplasm of bone: Secondary | ICD-10-CM

## 2018-03-30 DIAGNOSIS — C61 Malignant neoplasm of prostate: Principal | ICD-10-CM

## 2018-03-30 DIAGNOSIS — N1330 Unspecified hydronephrosis: Secondary | ICD-10-CM | POA: Diagnosis not present

## 2018-03-30 DIAGNOSIS — M2550 Pain in unspecified joint: Secondary | ICD-10-CM | POA: Diagnosis not present

## 2018-03-30 DIAGNOSIS — K219 Gastro-esophageal reflux disease without esophagitis: Secondary | ICD-10-CM | POA: Diagnosis not present

## 2018-03-30 DIAGNOSIS — C772 Secondary and unspecified malignant neoplasm of intra-abdominal lymph nodes: Secondary | ICD-10-CM | POA: Diagnosis not present

## 2018-03-30 DIAGNOSIS — J449 Chronic obstructive pulmonary disease, unspecified: Secondary | ICD-10-CM | POA: Diagnosis not present

## 2018-03-30 DIAGNOSIS — M549 Dorsalgia, unspecified: Secondary | ICD-10-CM | POA: Diagnosis not present

## 2018-03-30 DIAGNOSIS — M810 Age-related osteoporosis without current pathological fracture: Secondary | ICD-10-CM | POA: Diagnosis not present

## 2018-04-01 ENCOUNTER — Ambulatory Visit: Admit: 2018-04-01 | Discharge: 2018-04-02

## 2018-04-01 ENCOUNTER — Other Ambulatory Visit: Admit: 2018-04-01 | Discharge: 2018-04-02 | Payer: MEDICARE

## 2018-04-01 DIAGNOSIS — C7951 Secondary malignant neoplasm of bone: Secondary | ICD-10-CM

## 2018-04-01 DIAGNOSIS — C61 Malignant neoplasm of prostate: Principal | ICD-10-CM

## 2018-04-01 DIAGNOSIS — N3001 Acute cystitis with hematuria: Secondary | ICD-10-CM | POA: Diagnosis not present

## 2018-04-02 ENCOUNTER — Ambulatory Visit: Admit: 2018-04-02 | Discharge: 2018-04-03

## 2018-04-02 DIAGNOSIS — C7951 Secondary malignant neoplasm of bone: Secondary | ICD-10-CM

## 2018-04-02 DIAGNOSIS — C61 Malignant neoplasm of prostate: Principal | ICD-10-CM

## 2018-04-02 DIAGNOSIS — K219 Gastro-esophageal reflux disease without esophagitis: Secondary | ICD-10-CM | POA: Diagnosis not present

## 2018-04-02 DIAGNOSIS — M810 Age-related osteoporosis without current pathological fracture: Secondary | ICD-10-CM | POA: Diagnosis not present

## 2018-04-02 DIAGNOSIS — N1330 Unspecified hydronephrosis: Secondary | ICD-10-CM | POA: Diagnosis not present

## 2018-04-02 DIAGNOSIS — C772 Secondary and unspecified malignant neoplasm of intra-abdominal lymph nodes: Secondary | ICD-10-CM | POA: Diagnosis not present

## 2018-04-02 DIAGNOSIS — J449 Chronic obstructive pulmonary disease, unspecified: Secondary | ICD-10-CM | POA: Diagnosis not present

## 2018-04-02 DIAGNOSIS — M2550 Pain in unspecified joint: Secondary | ICD-10-CM | POA: Diagnosis not present

## 2018-04-02 DIAGNOSIS — M549 Dorsalgia, unspecified: Secondary | ICD-10-CM | POA: Diagnosis not present

## 2018-04-02 MED ORDER — CIPROFLOXACIN 500 MG TABLET
ORAL_TABLET | Freq: Two times a day (BID) | ORAL | 0 refills | 0 days | Status: CP
Start: 2018-04-02 — End: 2018-04-12

## 2018-04-05 ENCOUNTER — Ambulatory Visit: Admit: 2018-04-05 | Discharge: 2018-05-01 | Payer: MEDICARE

## 2018-04-05 ENCOUNTER — Ambulatory Visit: Admit: 2018-04-05 | Discharge: 2018-04-06

## 2018-04-05 DIAGNOSIS — C61 Malignant neoplasm of prostate: Principal | ICD-10-CM

## 2018-04-05 DIAGNOSIS — C7951 Secondary malignant neoplasm of bone: Secondary | ICD-10-CM

## 2018-04-05 DIAGNOSIS — M2550 Pain in unspecified joint: Secondary | ICD-10-CM | POA: Diagnosis not present

## 2018-04-05 DIAGNOSIS — J449 Chronic obstructive pulmonary disease, unspecified: Secondary | ICD-10-CM | POA: Diagnosis not present

## 2018-04-05 DIAGNOSIS — K219 Gastro-esophageal reflux disease without esophagitis: Secondary | ICD-10-CM | POA: Diagnosis not present

## 2018-04-05 DIAGNOSIS — M810 Age-related osteoporosis without current pathological fracture: Secondary | ICD-10-CM | POA: Diagnosis not present

## 2018-04-05 DIAGNOSIS — M549 Dorsalgia, unspecified: Secondary | ICD-10-CM | POA: Diagnosis not present

## 2018-04-05 DIAGNOSIS — C772 Secondary and unspecified malignant neoplasm of intra-abdominal lymph nodes: Secondary | ICD-10-CM | POA: Diagnosis not present

## 2018-04-05 DIAGNOSIS — N1330 Unspecified hydronephrosis: Secondary | ICD-10-CM | POA: Diagnosis not present

## 2018-04-26 ENCOUNTER — Ambulatory Visit: Admit: 2018-04-26 | Discharge: 2018-04-26 | Payer: MEDICARE | Attending: Internal Medicine | Primary: Internal Medicine

## 2018-04-26 ENCOUNTER — Other Ambulatory Visit: Admit: 2018-04-26 | Discharge: 2018-04-26 | Payer: MEDICARE

## 2018-04-26 DIAGNOSIS — C7951 Secondary malignant neoplasm of bone: Secondary | ICD-10-CM

## 2018-04-26 DIAGNOSIS — C61 Malignant neoplasm of prostate: Principal | ICD-10-CM

## 2018-04-26 DIAGNOSIS — C772 Secondary and unspecified malignant neoplasm of intra-abdominal lymph nodes: Secondary | ICD-10-CM | POA: Diagnosis not present

## 2018-04-26 DIAGNOSIS — T387X5D Adverse effect of androgens and anabolic congeners, subsequent encounter: Secondary | ICD-10-CM | POA: Diagnosis not present

## 2018-04-26 DIAGNOSIS — K219 Gastro-esophageal reflux disease without esophagitis: Secondary | ICD-10-CM | POA: Diagnosis not present

## 2018-04-26 DIAGNOSIS — R0781 Pleurodynia: Secondary | ICD-10-CM | POA: Diagnosis not present

## 2018-04-26 DIAGNOSIS — J449 Chronic obstructive pulmonary disease, unspecified: Secondary | ICD-10-CM | POA: Diagnosis not present

## 2018-04-26 DIAGNOSIS — M818 Other osteoporosis without current pathological fracture: Secondary | ICD-10-CM | POA: Diagnosis not present

## 2018-04-26 DIAGNOSIS — G893 Neoplasm related pain (acute) (chronic): Secondary | ICD-10-CM | POA: Diagnosis not present

## 2018-05-13 MED ORDER — OMEPRAZOLE 20 MG CAPSULE,DELAYED RELEASE
ORAL_CAPSULE | Freq: Every day | ORAL | 3 refills | 0 days | Status: CP
Start: 2018-05-13 — End: ?

## 2018-05-21 DIAGNOSIS — C61 Malignant neoplasm of prostate: Principal | ICD-10-CM

## 2018-05-21 DIAGNOSIS — C7951 Secondary malignant neoplasm of bone: Principal | ICD-10-CM

## 2018-05-21 MED ORDER — ABIRATERONE 250 MG TABLET
ORAL_TABLET | Freq: Every day | ORAL | 3 refills | 0.00000 days | Status: CP
Start: 2018-05-21 — End: 2018-07-30

## 2018-06-04 ENCOUNTER — Ambulatory Visit: Admit: 2018-06-04 | Discharge: 2018-06-05 | Payer: MEDICARE

## 2018-06-04 DIAGNOSIS — C7951 Secondary malignant neoplasm of bone: Secondary | ICD-10-CM

## 2018-06-04 DIAGNOSIS — C61 Malignant neoplasm of prostate: Principal | ICD-10-CM

## 2018-06-07 DIAGNOSIS — C7951 Secondary malignant neoplasm of bone: Secondary | ICD-10-CM

## 2018-06-07 DIAGNOSIS — C61 Malignant neoplasm of prostate: Principal | ICD-10-CM

## 2018-06-21 MED ORDER — TRAMADOL 50 MG TABLET
ORAL_TABLET | 0 refills | 0 days | Status: CP
Start: 2018-06-21 — End: 2018-07-21

## 2018-06-21 MED ORDER — IBUPROFEN 800 MG TABLET
ORAL_TABLET | 0 refills | 0 days | Status: CP
Start: 2018-06-21 — End: 2018-07-21

## 2018-07-21 MED ORDER — IBUPROFEN 800 MG TABLET
ORAL_TABLET | 11 refills | 0 days | Status: CP
Start: 2018-07-21 — End: ?

## 2018-07-21 MED ORDER — TRAMADOL 50 MG TABLET
ORAL_TABLET | 0 refills | 0 days | Status: CP
Start: 2018-07-21 — End: 2018-08-19

## 2018-07-27 ENCOUNTER — Institutional Professional Consult (permissible substitution): Admit: 2018-07-27 | Discharge: 2018-08-09 | Payer: MEDICARE

## 2018-07-27 ENCOUNTER — Ambulatory Visit: Admit: 2018-07-27 | Discharge: 2018-08-09 | Payer: MEDICARE

## 2018-07-27 ENCOUNTER — Other Ambulatory Visit: Admit: 2018-07-27 | Discharge: 2018-08-09 | Payer: MEDICARE

## 2018-07-27 ENCOUNTER — Ambulatory Visit: Admit: 2018-07-27 | Discharge: 2018-08-25 | Payer: MEDICARE

## 2018-07-27 DIAGNOSIS — C7951 Secondary malignant neoplasm of bone: Secondary | ICD-10-CM

## 2018-07-27 DIAGNOSIS — C61 Malignant neoplasm of prostate: Principal | ICD-10-CM

## 2018-07-30 MED ORDER — ZYTIGA 250 MG TABLET
ORAL_TABLET | 11 refills | 0 days | Status: CP
Start: 2018-07-30 — End: 2018-07-30

## 2018-07-30 MED ORDER — ABIRATERONE 250 MG TABLET
ORAL_TABLET | Freq: Every day | ORAL | 11 refills | 0.00000 days | Status: CP
Start: 2018-07-30 — End: ?

## 2018-08-02 MED ORDER — GABAPENTIN 100 MG CAPSULE
ORAL_CAPSULE | Freq: Every evening | ORAL | 0 refills | 0.00000 days | Status: CP
Start: 2018-08-02 — End: 2018-10-22

## 2018-08-09 ENCOUNTER — Telehealth: Admit: 2018-08-09 | Discharge: 2018-08-10 | Payer: MEDICARE | Attending: Internal Medicine | Primary: Internal Medicine

## 2018-08-17 ENCOUNTER — Ambulatory Visit
Admit: 2018-08-17 | Discharge: 2018-08-31 | Payer: MEDICARE | Attending: Radiation Oncology | Primary: Radiation Oncology

## 2018-08-17 DIAGNOSIS — C7951 Secondary malignant neoplasm of bone: Secondary | ICD-10-CM

## 2018-08-17 DIAGNOSIS — C61 Malignant neoplasm of prostate: Principal | ICD-10-CM

## 2018-08-18 ENCOUNTER — Ambulatory Visit: Admit: 2018-08-18 | Discharge: 2018-08-31 | Payer: MEDICARE

## 2018-08-18 DIAGNOSIS — C61 Malignant neoplasm of prostate: Principal | ICD-10-CM

## 2018-08-19 MED ORDER — PREDNISONE 5 MG TABLET
ORAL_TABLET | 0 refills | 0 days | Status: CP
Start: 2018-08-19 — End: ?

## 2018-08-19 MED ORDER — TRAMADOL 50 MG TABLET
ORAL_TABLET | 0 refills | 0 days | Status: CP
Start: 2018-08-19 — End: 2018-08-20

## 2018-08-20 MED ORDER — TRAMADOL 50 MG TABLET
ORAL_TABLET | 0 refills | 0 days | Status: CP
Start: 2018-08-20 — End: 2018-10-16

## 2018-09-21 ENCOUNTER — Telehealth
Admit: 2018-09-21 | Discharge: 2018-09-22 | Payer: MEDICARE | Attending: Hematology & Oncology | Primary: Hematology & Oncology

## 2018-09-21 DIAGNOSIS — C7951 Secondary malignant neoplasm of bone: Secondary | ICD-10-CM

## 2018-09-21 DIAGNOSIS — C61 Malignant neoplasm of prostate: Principal | ICD-10-CM

## 2018-09-28 MED ORDER — TAMSULOSIN 0.4 MG CAPSULE
ORAL_CAPSULE | 3 refills | 0 days | Status: CP
Start: 2018-09-28 — End: ?

## 2018-10-12 ENCOUNTER — Other Ambulatory Visit: Admit: 2018-10-12 | Discharge: 2018-10-18 | Payer: MEDICARE

## 2018-10-12 ENCOUNTER — Telehealth
Admit: 2018-10-12 | Discharge: 2018-10-18 | Payer: MEDICARE | Attending: Hematology & Oncology | Primary: Hematology & Oncology

## 2018-10-12 ENCOUNTER — Ambulatory Visit: Admit: 2018-10-12 | Discharge: 2018-10-18 | Payer: MEDICARE

## 2018-10-12 DIAGNOSIS — T387X5A Adverse effect of androgens and anabolic congeners, initial encounter: Secondary | ICD-10-CM

## 2018-10-12 DIAGNOSIS — C61 Malignant neoplasm of prostate: Principal | ICD-10-CM

## 2018-10-12 DIAGNOSIS — C7951 Secondary malignant neoplasm of bone: Secondary | ICD-10-CM

## 2018-10-12 DIAGNOSIS — R319 Hematuria, unspecified: Secondary | ICD-10-CM

## 2018-10-12 DIAGNOSIS — M818 Other osteoporosis without current pathological fracture: Secondary | ICD-10-CM

## 2018-10-16 MED ORDER — TRAMADOL 50 MG TABLET
ORAL_TABLET | 0 refills | 0 days | Status: CP
Start: 2018-10-16 — End: ?

## 2018-10-22 ENCOUNTER — Ambulatory Visit
Admit: 2018-10-22 | Discharge: 2018-10-23 | Payer: MEDICARE | Attending: Geriatric Medicine | Primary: Geriatric Medicine

## 2018-10-22 DIAGNOSIS — C61 Malignant neoplasm of prostate: Principal | ICD-10-CM

## 2018-10-22 DIAGNOSIS — M818 Other osteoporosis without current pathological fracture: Secondary | ICD-10-CM

## 2018-10-22 DIAGNOSIS — C7951 Secondary malignant neoplasm of bone: Secondary | ICD-10-CM

## 2018-10-22 DIAGNOSIS — T387X5A Adverse effect of androgens and anabolic congeners, initial encounter: Secondary | ICD-10-CM

## 2018-11-09 ENCOUNTER — Ambulatory Visit: Admit: 2018-11-09 | Discharge: 2018-11-15 | Payer: MEDICARE

## 2018-11-09 ENCOUNTER — Telehealth
Admit: 2018-11-09 | Discharge: 2018-11-15 | Payer: MEDICARE | Attending: Hematology & Oncology | Primary: Hematology & Oncology

## 2018-11-09 ENCOUNTER — Other Ambulatory Visit: Admit: 2018-11-09 | Discharge: 2018-11-15 | Payer: MEDICARE

## 2018-11-09 DIAGNOSIS — C7951 Secondary malignant neoplasm of bone: Secondary | ICD-10-CM

## 2018-11-09 DIAGNOSIS — Z66 Do not resuscitate: Secondary | ICD-10-CM

## 2018-11-09 DIAGNOSIS — C61 Malignant neoplasm of prostate: Secondary | ICD-10-CM

## 2018-11-15 MED ORDER — PREDNISONE 5 MG TABLET
ORAL_TABLET | 0 refills | 0 days | Status: CP
Start: 2018-11-15 — End: ?

## 2018-11-15 MED ORDER — TRAMADOL 50 MG TABLET
ORAL_TABLET | 0 refills | 0 days | Status: CP
Start: 2018-11-15 — End: ?

## 2018-11-26 ENCOUNTER — Ambulatory Visit (INDEPENDENT_AMBULATORY_CARE_PROVIDER_SITE_OTHER): Payer: Medicare HMO

## 2018-11-26 ENCOUNTER — Other Ambulatory Visit: Payer: Self-pay

## 2018-11-26 DIAGNOSIS — Z23 Encounter for immunization: Secondary | ICD-10-CM

## 2018-12-03 ENCOUNTER — Other Ambulatory Visit: Payer: Self-pay

## 2018-12-03 DIAGNOSIS — Z20822 Contact with and (suspected) exposure to covid-19: Secondary | ICD-10-CM

## 2018-12-04 LAB — NOVEL CORONAVIRUS, NAA: SARS-CoV-2, NAA: NOT DETECTED

## 2018-12-07 ENCOUNTER — Ambulatory Visit: Admit: 2018-12-07 | Discharge: 2018-12-13 | Payer: MEDICARE

## 2018-12-07 ENCOUNTER — Institutional Professional Consult (permissible substitution): Admit: 2018-12-07 | Discharge: 2018-12-13 | Payer: MEDICARE

## 2018-12-07 ENCOUNTER — Other Ambulatory Visit: Admit: 2018-12-07 | Discharge: 2018-12-13 | Payer: MEDICARE

## 2018-12-07 ENCOUNTER — Ambulatory Visit
Admit: 2018-12-07 | Discharge: 2018-12-13 | Payer: MEDICARE | Attending: Hematology & Oncology | Primary: Hematology & Oncology

## 2018-12-07 DIAGNOSIS — R079 Chest pain, unspecified: Secondary | ICD-10-CM

## 2018-12-07 DIAGNOSIS — C7951 Secondary malignant neoplasm of bone: Secondary | ICD-10-CM

## 2018-12-07 DIAGNOSIS — C61 Malignant neoplasm of prostate: Secondary | ICD-10-CM

## 2018-12-07 DIAGNOSIS — Z5112 Encounter for antineoplastic immunotherapy: Secondary | ICD-10-CM

## 2018-12-07 DIAGNOSIS — Z66 Do not resuscitate: Secondary | ICD-10-CM

## 2018-12-16 DIAGNOSIS — C7951 Secondary malignant neoplasm of bone: Principal | ICD-10-CM

## 2018-12-16 DIAGNOSIS — C61 Malignant neoplasm of prostate: Principal | ICD-10-CM

## 2018-12-19 MED ORDER — TRAMADOL 50 MG TABLET: tablet | 0 refills | 0 days | Status: AC

## 2018-12-28 DIAGNOSIS — C7951 Secondary malignant neoplasm of bone: Principal | ICD-10-CM

## 2018-12-28 DIAGNOSIS — C61 Malignant neoplasm of prostate: Principal | ICD-10-CM

## 2018-12-29 ENCOUNTER — Encounter (HOSPITAL_COMMUNITY): Payer: Self-pay | Admitting: Emergency Medicine

## 2018-12-29 ENCOUNTER — Observation Stay (HOSPITAL_COMMUNITY)
Admission: EM | Admit: 2018-12-29 | Discharge: 2018-12-30 | Disposition: A | Discharge status: Home / Self Care | Payer: Medicare HMO | Attending: Family Medicine | Admitting: Family Medicine

## 2018-12-29 ENCOUNTER — Emergency Department (HOSPITAL_COMMUNITY): Payer: Medicare HMO

## 2018-12-29 ENCOUNTER — Other Ambulatory Visit: Payer: Self-pay

## 2018-12-29 DIAGNOSIS — Z79899 Other long term (current) drug therapy: Secondary | ICD-10-CM | POA: Diagnosis not present

## 2018-12-29 DIAGNOSIS — S2232XD Fracture of one rib, left side, subsequent encounter for fracture with routine healing: Secondary | ICD-10-CM | POA: Insufficient documentation

## 2018-12-29 DIAGNOSIS — Z7952 Long term (current) use of systemic steroids: Secondary | ICD-10-CM | POA: Insufficient documentation

## 2018-12-29 DIAGNOSIS — Z87891 Personal history of nicotine dependence: Secondary | ICD-10-CM | POA: Insufficient documentation

## 2018-12-29 DIAGNOSIS — K449 Diaphragmatic hernia without obstruction or gangrene: Secondary | ICD-10-CM | POA: Insufficient documentation

## 2018-12-29 DIAGNOSIS — R0789 Other chest pain: Secondary | ICD-10-CM | POA: Diagnosis present

## 2018-12-29 DIAGNOSIS — E876 Hypokalemia: Principal | ICD-10-CM | POA: Insufficient documentation

## 2018-12-29 DIAGNOSIS — K219 Gastro-esophageal reflux disease without esophagitis: Secondary | ICD-10-CM | POA: Insufficient documentation

## 2018-12-29 DIAGNOSIS — Z791 Long term (current) use of non-steroidal anti-inflammatories (NSAID): Secondary | ICD-10-CM | POA: Insufficient documentation

## 2018-12-29 DIAGNOSIS — X58XXXD Exposure to other specified factors, subsequent encounter: Secondary | ICD-10-CM | POA: Diagnosis not present

## 2018-12-29 DIAGNOSIS — M4120 Other idiopathic scoliosis, site unspecified: Secondary | ICD-10-CM | POA: Insufficient documentation

## 2018-12-29 DIAGNOSIS — J449 Chronic obstructive pulmonary disease, unspecified: Secondary | ICD-10-CM | POA: Diagnosis not present

## 2018-12-29 DIAGNOSIS — Z20828 Contact with and (suspected) exposure to other viral communicable diseases: Secondary | ICD-10-CM | POA: Insufficient documentation

## 2018-12-29 DIAGNOSIS — C7951 Secondary malignant neoplasm of bone: Secondary | ICD-10-CM | POA: Diagnosis not present

## 2018-12-29 DIAGNOSIS — C61 Malignant neoplasm of prostate: Secondary | ICD-10-CM | POA: Insufficient documentation

## 2018-12-29 LAB — TROPONIN I (HIGH SENSITIVITY)
Troponin I (High Sensitivity): 6 ng/L (ref <18)
Troponin I (High Sensitivity): 8 ng/L (ref <18)

## 2018-12-29 LAB — BASIC METABOLIC PANEL
Anion gap: 10 (ref 5–15)
Anion gap: 11 (ref 5–15)
Anion gap: 9 (ref 5–15)
BUN: 15 mg/dL (ref 8–23)
BUN: 14 mg/dL (ref 8–23)
BUN: 12 mg/dL (ref 8–23)
CO2: 29 mmol/L (ref 22–32)
CO2: 29 mmol/L (ref 22–32)
CO2: 27 mmol/L (ref 22–32)
Calcium: 9.2 mg/dL (ref 8.9–10.3)
Calcium: 8.9 mg/dL (ref 8.9–10.3)
Calcium: 8.4 mg/dL — ABNORMAL LOW (ref 8.9–10.3)
Chloride: 97 mmol/L — ABNORMAL LOW (ref 98–111)
Chloride: 97 mmol/L — ABNORMAL LOW (ref 98–111)
Chloride: 101 mmol/L (ref 98–111)
Creatinine, Ser: 0.77 mg/dL (ref 0.61–1.24)
Creatinine, Ser: 0.76 mg/dL (ref 0.61–1.24)
Creatinine, Ser: 0.66 mg/dL (ref 0.61–1.24)
GFR calc Af Amer: >60 mL/min (ref >60)
GFR calc Af Amer: >60 mL/min (ref >60)
GFR calc Af Amer: >60 mL/min (ref >60)
GFR calc non Af Amer: >60 mL/min (ref >60)
GFR calc non Af Amer: >60 mL/min (ref >60)
GFR calc non Af Amer: >60 mL/min (ref >60)
Glucose, Bld: 106 mg/dL — ABNORMAL HIGH (ref 70–99)
Glucose, Bld: 105 mg/dL — ABNORMAL HIGH (ref 70–99)
Glucose, Bld: 112 mg/dL — ABNORMAL HIGH (ref 70–99)
Potassium: 2.6 mmol/L — CL (ref 3.5–5.1)
Potassium: 2.4 mmol/L — CL (ref 3.5–5.1)
Potassium: 3.5 mmol/L (ref 3.5–5.1)
Sodium: 136 mmol/L (ref 135–145)
Sodium: 137 mmol/L (ref 135–145)
Sodium: 137 mmol/L (ref 135–145)

## 2018-12-29 LAB — CBC
HCT: 34.5 % — ABNORMAL LOW (ref 39.0–52.0)
Hemoglobin: 11.8 g/dL — ABNORMAL LOW (ref 13.0–17.0)
MCH: 34.9 pg — ABNORMAL HIGH (ref 26.0–34.0)
MCHC: 34.2 g/dL (ref 30.0–36.0)
MCV: 102.1 fL — ABNORMAL HIGH (ref 80.0–100.0)
Platelets: 190 10*3/uL (ref 150–400)
RBC: 3.38 MIL/uL — ABNORMAL LOW (ref 4.22–5.81)
RDW: 12 % (ref 11.5–15.5)
WBC: 4 10*3/uL (ref 4.0–10.5)
nRBC: 0 % (ref 0.0–0.2)

## 2018-12-29 LAB — MAGNESIUM: Magnesium: 1.9 mg/dL (ref 1.7–2.4)

## 2018-12-29 LAB — SARS CORONAVIRUS 2 (TAT 6-24 HRS): SARS Coronavirus 2: NEGATIVE

## 2018-12-29 MED ORDER — POTASSIUM CHLORIDE CRYS ER 20 MEQ PO TBCR
40.0000 meq | EXTENDED_RELEASE_TABLET | Freq: Once | ORAL | Status: AC
  Administered 2018-12-29: 40 meq via ORAL
  Filled 2018-12-29: qty 2

## 2018-12-29 MED ORDER — ENOXAPARIN SODIUM 40 MG/0.4ML ~~LOC~~ SOLN
40.0000 mg | SUBCUTANEOUS | Status: DC
  Administered 2018-12-29: 40 mg via SUBCUTANEOUS
  Filled 2018-12-29: qty 0.4

## 2018-12-29 MED ORDER — PREDNISONE 5 MG PO TABS
5.0000 mg | ORAL_TABLET | Freq: Every day | ORAL | Status: DC
  Filled 2018-12-29 (×2): qty 1

## 2018-12-29 MED ORDER — SODIUM CHLORIDE 0.9% FLUSH
3.0000 mL | Freq: Once | INTRAVENOUS | Status: AC
  Administered 2018-12-29: 3 mL via INTRAVENOUS

## 2018-12-29 MED ORDER — TAMSULOSIN HCL 0.4 MG PO CAPS
0.4000 mg | ORAL_CAPSULE | Freq: Two times a day (BID) | ORAL | Status: DC
  Administered 2018-12-29 – 2018-12-30 (×2): 0.4 mg via ORAL
  Filled 2018-12-29 (×2): qty 1

## 2018-12-29 MED ORDER — PANTOPRAZOLE SODIUM 40 MG PO TBEC
40.0000 mg | DELAYED_RELEASE_TABLET | Freq: Every day | ORAL | Status: DC
  Administered 2018-12-30: 40 mg via ORAL
  Filled 2018-12-29: qty 1

## 2018-12-29 MED ORDER — POTASSIUM CHLORIDE 10 MEQ/100ML IV SOLN
10.0000 meq | Freq: Once | INTRAVENOUS | Status: AC
  Administered 2018-12-29: 10 meq via INTRAVENOUS
  Filled 2018-12-29: qty 100

## 2018-12-29 MED ORDER — ACETAMINOPHEN 650 MG RE SUPP: 650.0000 mg | Freq: Four times a day (QID) | RECTAL | Status: DC | PRN

## 2018-12-29 MED ORDER — TRAMADOL HCL 50 MG PO TABS
50.0000 mg | ORAL_TABLET | Freq: Four times a day (QID) | ORAL | Status: DC
  Administered 2018-12-29 (×2): 50 mg via ORAL
  Filled 2018-12-29 (×4): qty 1

## 2018-12-29 MED ORDER — POTASSIUM CHLORIDE 10 MEQ/100ML IV SOLN
10.0000 meq | INTRAVENOUS | Status: AC
  Administered 2018-12-29 (×2): 10 meq via INTRAVENOUS
  Filled 2018-12-29 (×2): qty 100

## 2018-12-29 MED ORDER — IOHEXOL 350 MG/ML SOLN
50.0000 mL | Freq: Once | INTRAVENOUS | Status: AC | PRN
  Administered 2018-12-29: 50 mL via INTRAVENOUS

## 2018-12-29 MED ORDER — ONDANSETRON HCL 4 MG PO TABS: 4.0000 mg | ORAL_TABLET | Freq: Four times a day (QID) | ORAL | Status: DC | PRN

## 2018-12-29 MED ORDER — ONDANSETRON HCL 4 MG/2ML IJ SOLN: 4.0000 mg | Freq: Four times a day (QID) | INTRAMUSCULAR | Status: DC | PRN

## 2018-12-29 MED ORDER — ACETAMINOPHEN 325 MG PO TABS: 650.0000 mg | ORAL_TABLET | Freq: Four times a day (QID) | ORAL | Status: DC | PRN

## 2018-12-29 MED ORDER — DOCUSATE SODIUM 100 MG PO CAPS
100.0000 mg | ORAL_CAPSULE | Freq: Two times a day (BID) | ORAL | Status: DC
  Filled 2018-12-29 (×2): qty 1

## 2018-12-29 NOTE — ED Provider Notes (Signed)
[facility] EMERGENCY DEPARTMENT Provider Note   CSN: [id number] Arrival date & time: 12/29/18  0405     History   Chief Complaint Chief Complaint  Patient presents with  . Chest Pain    HPI [name] [name] is a 78 y.o. male.     The history is provided by the patient and medical records. No language interpreter was used.  Chest Pain    78 year old male with history of metastatic prostate cancer, remote rib fracture, COPD, GERD presenting for evaluation of chest pain.  Patient report acute onset of left lateral chest pain that started yesterday.  Described pain as a pleuritic pain worse when he takes a deep breath, localized, nonradiating, sometimes worsened with positional change.  Pain is only present when he takes deep breath.  Described as pulled muscle sensation.  Pain is minimal at this time.  Denies any associated fever, chills, productive cough, hemoptysis, lightheadedness, dizziness, diaphoresis, nausea, dyspnea on exertion, back pain or abdominal pain.  Denies any recent injury.  He does take tramadol 4 times daily for his cancer pain.  He denies any recent sick contact with anyone with COVID-19.  He is currently receiving chemo and radiation for his prostate cancer.  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  He denies any prior history of PE or DVT.  Past Medical History:  Diagnosis Date  . BACKACHE NOS   . Carbuncle and furuncle of trunk   . COPD (chronic obstructive pulmonary disease) ([place])   . GERD (gastroesophageal reflux disease)   . Prostate cancer ([place])   . Right rib fracture 05/2012   traumatic, min displaced on cxr  . Scoliosis   . Sebaceous cyst     Patient Active Problem List   Diagnosis Date Noted  . Idiopathic scoliosis 03/20/2017  . Prostate cancer metastatic to bone ([place]) 08/15/2016  . Left-sided chest wall pain 10/12/2015  . Routine health maintenance 01/14/2011  . Levoscoliosis 12/26/2006     Past Surgical History:  Procedure Laterality Date  . EXCISIONAL HEMORRHOIDECTOMY    . TONSILLECTOMY          Home Medications    Prior to Admission medications   Medication Sig Start Date End Date Taking? Authorizing Provider  abiraterone Acetate (ZYTIGA) 250 MG tablet Take 1,000 mg by mouth. 06/05/16   [provider]  Calcium Carbonate-Vit D-Min (CALCIUM 600+D PLUS MINERALS) 600-400 MG-UNIT TABS Take by mouth.    [provider]  Cholecalciferol (VITAMIN D3) 5000 units CAPS Take by mouth daily.    [provider]  ibuprofen (ADVIL,MOTRIN) 800 MG tablet Take by mouth. 02/05/18 02/05/19  [provider]  Multiple Vitamins-Minerals (EYE VITAMINS & MINERALS) TABS Take by mouth. 05/02/16   [provider]  omeprazole (PRILOSEC) 20 MG capsule Take 1 capsule (20 mg total) by mouth 2 (two) times daily before a meal. Patient taking differently: Take 20 mg by mouth daily.  04/04/16   Irene Shipper, MD  predniSONE (DELTASONE) 5 MG tablet Take 5 mg by mouth. 06/05/16   [provider]  tamsulosin (FLOMAX) 0.4 MG CAPS capsule Take 0.4 mg by mouth 2 (two) times daily.  02/22/17   [provider]  traMADol (ULTRAM) 50 MG tablet Take 50 mg by mouth 4 (four) times daily. for pain 11/02/17   [provider]    Family History Family History  Problem Relation Age of Onset  . Alzheimer's disease Mother   .  Diabetes Mother   . Diabetes Father   . Cirrhosis Paternal Grandfather   . Colon cancer Neg Hx   . Esophageal cancer Neg Hx   . Rectal cancer Neg Hx   . Stomach cancer Neg Hx   . Liver cancer Neg Hx   . Prostate cancer Neg Hx     Social History Social History   Tobacco Use  . Smoking status: Former Smoker    Packs/day: 3.00    Years: 15.00    Pack years: 45.00    Types: Cigarettes    Quit date: 03/04/1975    Years since quitting: 43.8  . Smokeless tobacco: Never Used  . 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 Alcohol use: Yes    Alcohol/week: 0.0 standard drinks    Comment: 1 glass wine a month  . Drug use: No     Allergies   Latex   Review of Systems Review of Systems  Cardiovascular: Positive for chest pain.  All other systems reviewed and are negative.    Physical Exam Updated Vital Signs BP 132/81   Pulse 97   Temp 99.1 F (37.3 C)   Resp 16   SpO2 97%   Physical Exam Vitals signs and nursing note reviewed.  Constitutional:      General: He is not in acute distress.    Appearance: He is well-developed.     Comments: Elderly male resting comfortably in no acute discomfort.  HENT:     Head: Atraumatic.  Eyes:     Conjunctiva/sclera: Conjunctivae normal.  Neck:     Musculoskeletal: Neck supple.  Cardiovascular:     Rate and Rhythm: Normal rate and regular rhythm.     Heart sounds: Normal heart sounds.  Pulmonary:     Effort: Pulmonary effort is normal.     Breath sounds: Normal breath sounds. No decreased breath sounds, wheezing, rhonchi or rales.  Chest:     Chest wall: No tenderness or crepitus.  Abdominal:     Palpations: Abdomen is soft.  Genitourinary:    Rectum: Guaiac result negative.  Musculoskeletal:     Right lower leg: No edema.     Left lower leg: No edema.  Skin:    Findings: No rash.  Neurological:     Mental Status: He is alert.  Psychiatric:        Mood and Affect: Mood normal.      ED Treatments / Results  Labs (all labs ordered are listed, but only abnormal results are displayed) Labs Reviewed  BASIC METABOLIC PANEL - Abnormal; Notable for the following components:      Result Value   Potassium 2.6 (*)    Chloride 97 (*)    Glucose, Bld 106 (*)    All other components within normal limits  CBC - Abnormal; Notable for the following components:   RBC 3.38 (*)    Hemoglobin 11.8 (*)    HCT 34.5 (*)    MCV 102.1 (*)    MCH 34.9 (*)    All other components within normal limits  BASIC  METABOLIC PANEL - Abnormal; Notable for the following components:   Potassium 2.4 (*)    Chloride 97 (*)    Glucose, Bld 105 (*)    All other components within normal limits  SARS CORONAVIRUS 2 (TAT 6-24 HRS)  TROPONIN I (HIGH SENSITIVITY)  TROPONIN I (HIGH SENSITIVITY)    EKG EKG  Interpretation  Date/Time:  Wednesday December 29 2018 04:16:37 EDT Ventricular Rate:  95 PR Interval:  144 QRS Duration: 78 QT Interval:  386 QTC Calculation: 485 R Axis:   -63 Text Interpretation: Sinus rhythm with occasional Premature ventricular complexes Left anterior fascicular block Cannot rule out Inferior infarct (masked by fascicular block?) , age undetermined Abnormal ECG When compared with ECG of 03/01/2000, Premature ventricular complexes are now present Confirmed by Delora Fuel ([id number]) on 12/29/2018 4:35:38 AM   Radiology Dg Chest 2 View  Result Date: 12/29/2018 CLINICAL DATA:  Chest pain EXAM: CHEST - 2 VIEW COMPARISON:  06/05/2017 FINDINGS: Chronic reticulation at the left base. Normal heart size and aortic contours with a superimposed moderate hiatal hernia. Large lung volumes. Artifact from EKG leads. There is no edema, consolidation, effusion, or pneumothorax. Spinal degeneration with scoliosis. Left second rib fracture has occurred since comparison but does not appear acute (there is spurring/mature periosteal reaction at the margins). IMPRESSION: 1. No acute finding when compared to prior. 2. Hiatal hernia and left base scarring. 3. Left posterior second rib fracture, likely nonacute. Electronically Signed   By: Monte Fantasia M.D.   On: 12/29/2018 04:57    Procedures .Critical Care Performed by: Domenic Moras, PA-C Authorized by: Domenic Moras, PA-C   Critical care provider statement:    Critical care time (minutes):  45   Critical care was time spent personally by me on the following activities:  Discussions with consultants, evaluation of patient's response to treatment, examination  of patient, ordering and performing treatments and interventions, ordering and review of laboratory studies, ordering and review of radiographic studies, pulse oximetry, re-evaluation of patient's condition, obtaining history from patient or surrogate and review of old charts   (including critical care time)  Medications Ordered in ED Medications  potassium chloride 10 mEq in 100 mL IVPB (10 mEq Intravenous New Bag/Given 12/29/18 0953)  sodium chloride flush (NS) 0.9 % injection 3 mL (3 mLs Intravenous Given 12/29/18 0955)  potassium chloride SA (KLOR-CON) CR tablet 40 mEq (40 mEq Oral Given 12/29/18 0947)  iohexol (OMNIPAQUE) 350 MG/ML injection 50 mL (50 mLs Intravenous Contrast Given 12/29/18 0917)     Initial Impression / Assessment and Plan / ED Course  I have reviewed the triage vital signs and the nursing notes.  Pertinent labs & imaging results that were available during my care of the patient were reviewed by me and considered in my medical decision making (see chart for details).        BP (!) 152/94   Pulse 87   Temp 99.1 F (37.3 C)   Resp (!) 23   SpO2 95%    Final Clinical Impressions(s) / ED Diagnoses   Final diagnoses:  Hypokalemia  Atypical chest pain    ED Discharge Orders    None     8:29 AM Patient is an elderly male with history of metastatic prostate cancer here with left-sided pleuritic chest pain that started since yesterday.  Pain is not reproducible on exam, initial chest x-ray unremarkable.  He does have an old rib fracture which is not related to his current pain.  However, in the setting of pleuritic chest pain, and cancer, I think will be prudent to obtain a chest CT angiogram to rule out PE as well as to assess for potential malignancy causing his discomfort.  Labs remarkable for a potassium of 2.6 which is much lower than his baseline.  No obvious reason as the cause for his hypokalemia.  No evidence of U waves on EKG.  Will provide both  oral and IV supplementation.  Delta troponin is negative, EKG without concerning acute ischemic changes.  Doubt ACS.  The remainder of his labs are reassuring.  10:29 AM Repeat blood show potassium level is 2.4, decreased from prior.  He is receiving supplementation.  Chest CT angiogram without acute finding.  Patient does endorse muscle cramp since yesterday, may be secondary to low potassium level.  Suspect hypokalemia may be secondary to his current chemo medication.  Will consult for admission.  Will obtain COVID-19 testing.  [name] [name] was evaluated in Emergency Department on 12/29/2018 for the symptoms described in the history of present illness. He was evaluated in the context of the global COVID-19 pandemic, which necessitated consideration that the patient might be at risk for infection with the SARS-CoV-2 virus that causes COVID-19. Institutional protocols and algorithms that pertain to the evaluation of patients at risk for COVID-19 are in a state of rapid change based on information released by regulatory bodies including the CDC and federal and state organizations. These policies and algorithms were followed during the patient's care in the ED.    Domenic Moras, PA-C 12/29/18 1046    Blanchie Dessert, MD 12/29/18 1205

## 2018-12-29 NOTE — ED Notes (Signed)
Patient transported to CT 

## 2018-12-29 NOTE — H&P (Signed)
History and Physical    [name] [name] Pam [id number] DOB: 1941/02/06 DOA: 12/29/2018  PCP: Hoyt Koch, MD Consultants:  [facility]; [facility] rad onc Patient coming from:  Home - lives with wife; Donald Prose: Wife, [phone]; [phone]  Chief Complaint: Chest pain  HPI: Benajmin Dunham Kepple is a 78 y.o. male with medical history significant of metastatic prostate CA and COPD presenting with chest pain.  He reports that yesterday afternoon he developed with pleuritic CP on the lateral left chest with cramping.  He was concerned that he had a hematoma and thought that might have been related.   No unusual activity recently.  He is only allowed to lift 5 pounds.  He been taking [place] and prednisone for his prostate CA for hormone-depletion therapy.  This stopped working a few months ago and his PSA is markedly elevated (66).  He has been arranging scans.  His cancer is metastatic to the spine (S2) and a right-sided rib.  He has been eating and drinking normally without n/v.  He has bouts with constipation.  He was wlaking 1.2 miles in 20 minutes or so, as he was previously a speed walker and is trying to keep that up.  He has been using a cane the last few days due to shallow breathing and concern for falls.  His legs and hips are also weak and painful.    ED Course:  Muscular/atypical chest pain; K+ is 2.6, 2.4.  ?related to chemo.  No n/v or obvious loss.  CTA negative for PE.  Getting PO and IV KCl now.  Review of Systems: As per HPI; otherwise review of systems reviewed and negative.   Ambulatory Status:  Ambulates without assistance  Past Medical History:  Diagnosis Date  . BACKACHE NOS   . Carbuncle and furuncle of trunk   . COPD (chronic obstructive pulmonary disease) ([facility])    mild, no recent problems  . GERD (gastroesophageal reflux disease)   . Prostate cancer [facility]    metastatic cancer to the spine  . Right rib fracture 05/2012   traumatic, min displaced on cxr  .  Scoliosis   . Sebaceous cyst     Past Surgical History:  Procedure Laterality Date  . EXCISIONAL HEMORRHOIDECTOMY    . TONSILLECTOMY      Social History   Socioeconomic History  . Marital status: Married    Spouse name: Not on file  . Number of children: 0  . Years of education: Not on file  . Highest education level: Not on file  Occupational History  . Occupation: retired  Scientific laboratory technician  . Financial resource strain: Not on file  . Food insecurity    Worry: Not on file    Inability: Not on file  . Transportation needs    Medical: Not on file    Non-medical: Not on file  Tobacco Use  . Smoking status: Former Smoker    Packs/day: 3.00    Years: 15.00    Pack years: 45.00    Types: Cigarettes    Quit date: 03/04/1975    Years since quitting: 43.8  . Smokeless tobacco: Never Used  . 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 Alcohol use: Not Currently    Alcohol/week: 0.0 standard drinks    Comment: rare  . Drug use: No  . Sexual activity: Not Currently  Lifestyle  . Physical activity  Days per week: Not on file    Minutes per session: Not on file  . Stress: Not on file  Relationships  . Social Herbalist on phone: Not on file    Gets together: Not on file    Attends religious service: Not on file    Active member of club or organization: Not on file    Attends meetings of clubs or organizations: Not on file    Relationship status: Not on file  . Intimate partner violence    Fear of current or ex partner: Not on file    Emotionally abused: Not on file    Physically abused: Not on file    Forced sexual activity: Not on file  Other Topics Concern  . Not on file  Social History Narrative   Married 1968   Retired Programme researcher, broadcasting/film/video; very active in politics   SO in good health-well controlled blood pressure, obesity    Allergies  Allergen Reactions  . Latex Itching and Rash    Family History   Problem Relation Age of Onset  . Alzheimer's disease Mother   . Diabetes Mother   . Diabetes Father   . Cirrhosis Paternal Grandfather   . Colon cancer Neg Hx   . Esophageal cancer Neg Hx   . Rectal cancer Neg Hx   . Stomach cancer Neg Hx   . Liver cancer Neg Hx   . Prostate cancer Neg Hx     Prior to Admission medications   Medication Sig Start Date End Date Taking? Authorizing Provider  abiraterone Acetate (ZYTIGA) 250 MG tablet Take 1,000 mg by mouth. 06/05/16   [provider]  Calcium Carbonate-Vit D-Min (CALCIUM 600+D PLUS MINERALS) 600-400 MG-UNIT TABS Take by mouth.    [provider]  Cholecalciferol (VITAMIN D3) 5000 units CAPS Take by mouth daily.    [provider]  ibuprofen (ADVIL,MOTRIN) 800 MG tablet Take by mouth. 02/05/18 02/05/19  [provider]  Multiple Vitamins-Minerals (EYE VITAMINS & MINERALS) TABS Take by mouth. 05/02/16   [provider]  omeprazole (PRILOSEC) 20 MG capsule Take 1 capsule (20 mg total) by mouth 2 (two) times daily before a meal. Patient taking differently: Take 20 mg by mouth daily.  04/04/16   Irene Shipper, MD  predniSONE (DELTASONE) 5 MG tablet Take 5 mg by mouth. 06/05/16   [provider]  tamsulosin (FLOMAX) 0.4 MG CAPS capsule Take 0.4 mg by mouth 2 (two) times daily.  02/22/17   [provider]  traMADol (ULTRAM) 50 MG tablet Take 50 mg by mouth 4 (four) times daily. for pain 11/02/17   [provider]    Physical Exam: Vitals:   12/29/18 0459 12/29/18 0955  BP: 132/81 (!) 152/94  Pulse: 97 87  Resp: 16 (!) 23  Temp: 99.1 F (37.3 C)   SpO2: 97% 95%     . General:  Appears calm and comfortable and is NAD . Eyes:  PERRL, EOMI, normal lids, iris . ENT:  grossly normal hearing, lips & tongue, mmm . Neck:  no LAD, masses or thyromegaly . Cardiovascular:  RRR, no m/r/g. No LE edema.  Chest wall pain is localized to left axillary line without TTP . Respiratory:   CTA  bilaterally with no wheezes/rales/rhonchi.  Normal respiratory effort. . Abdomen:  soft, NT, ND, NABS . Skin:  no rash or induration seen on limited exam . Musculoskeletal:  grossly normal tone BUE/BLE, good ROM, no bony abnormality .  Psychiatric:  grossly normal mood and affect, speech fluent and appropriate, AOx3 . Neurologic:  CN 2-12 grossly intact, moves all extremities in coordinated fashion, sensation intact    Radiological Exams on Admission: Dg Chest 2 View  Result Date: 12/29/2018 CLINICAL DATA:  Chest pain EXAM: CHEST - 2 VIEW COMPARISON:  06/05/2017 FINDINGS: Chronic reticulation at the left base. Normal heart size and aortic contours with a superimposed moderate hiatal hernia. Large lung volumes. Artifact from EKG leads. There is no edema, consolidation, effusion, or pneumothorax. Spinal degeneration with scoliosis. Left second rib fracture has occurred since comparison but does not appear acute (there is spurring/mature periosteal reaction at the margins). IMPRESSION: 1. No acute finding when compared to prior. 2. Hiatal hernia and left base scarring. 3. Left posterior second rib fracture, likely nonacute. Electronically Signed   By: Monte Fantasia M.D.   On: 12/29/2018 04:57   Ct Angio Chest Pe W And/or Wo Contrast  Result Date: 12/29/2018 CLINICAL DATA:  Chest pain.  History of metastatic prostate cancer. EXAM: CT ANGIOGRAPHY CHEST WITH CONTRAST TECHNIQUE: Multidetector CT imaging of the chest was performed using the standard protocol during bolus administration of intravenous contrast. Multiplanar CT image reconstructions and MIPs were obtained to evaluate the vascular anatomy. CONTRAST:  47mL OMNIPAQUE IOHEXOL 350 MG/ML SOLN COMPARISON:  None. FINDINGS: Cardiovascular: Satisfactory opacification of the pulmonary arteries to the segmental level. No evidence of pulmonary embolism. Normal heart size. No pericardial effusion. Mild coronary artery calcifications are noted.  Mediastinum/Nodes: Moderate size sliding-type hiatal hernia is noted. No adenopathy is noted. Thyroid gland is unremarkable. Lungs/Pleura: No pneumothorax or pleural effusion is noted. Minimal bilateral posterior basilar subsegmental atelectasis is noted. Upper Abdomen: No acute abnormality. Musculoskeletal: Sclerotic densities are noted in the visualized lumbar vertebral bodies, with probable old compression fracture of T12 which may be pathologic. Review of the MIP images confirms the above findings. IMPRESSION: No definite evidence of pulmonary embolus. Mild coronary artery calcifications are noted. Moderate size sliding-type hiatal hernia. Sclerotic densities are noted in the visualized lumbar vertebral bodies concerning for osseous metastases. Probable old T12 compression fracture is noted which may be pathologic in origin. Electronically Signed   By: Marijo Conception M.D.   On: 12/29/2018 09:30    EKG: Independently reviewed.  NSR with rate 95; nonspecific ST changes with no evidence of acute ischemia   Labs on Admission: I have personally reviewed the available labs and imaging studies at the time of the admission.  Pertinent labs:   K+ 2.6, 2.4 Glucose 105 WBC 4.0 Hgb 11.8 HS troponin 8   Assessment/Plan Principal Problem:   Hypokalemia Active Problems:   Left-sided chest wall pain   Prostate cancer metastatic to bone ([facility])   Hypokalemia -Patient without reported GI losses or decreased PO intake presenting with crampy pain that appears to be related to symptomatic hypokalemia -He is on daily prednisone as well as abiraterone and these medications can both be associated with low K+ and so perhaps this is the cause -K+ as low as 2.4 -This was repleted with 30 mEq IV KCl as well as 80 mEq PO Kcl - which would be anticipated to increase K+ to 3.5 -Recheck of K+ is exactly 3.5 -Will check Mag++ -Will recheck in AM -Assuming recurrence, patient may benefit from outpatient PO  KCl -Will monitor on telemetry overnight -Anticipate d/c to home in AM  Chest wall pain -He reports crampy chest wall pain -His description is not c/w ACS -The pain is located  very clearly in the left axillary region along the mid-rib -HS troponin negative -Currently low suspicion for ACS -Telemetry monitoring overnight is related to low K+ rather than chest pain  Metastatic prostate CA -Last office visit was 10/6 -He has castration-resistant disease with recent increase in PSA and 2 new bone lesions on bone scan on last imaging in 10/19 -He was recommended to be changed to [place] and is planning to do this in May -He is continuing Lupron, oral abiraterone, prednisone -If ongoing issue with hypoK+, his oncologist may need to be contacted to discuss further    Note: This patient has been tested and is pending for the novel coronavirus COVID-19.  DVT prophylaxis:  Lovenox  Code Status:  Full - confirmed with patient/family; despite prior status of DNR, he has decided to change his mind Family Communication: Wife was present throughout evaluation Disposition Plan:  Home once clinically improved Consults called: None  Admission status: It is my clinical opinion that referral for OBSERVATION is reasonable and necessary in this patient based on the above information provided. The aforementioned taken together are felt to place the patient at high risk for further clinical deterioration. However it is anticipated that the patient may be medically stable for discharge from the hospital within 24 to 48 hours.    Karmen Bongo MD [facility]   How to contact the [facility] Attending or Consulting provider [place] or covering provider during after hours [place], for this patient?  1. Check the care team in [facility] and look for a) attending/consulting [facility] provider listed and b) the [facility] team listed 2. Log into [email or web address] and use [place] universal password to access. If you do not  have the password, please contact the hospital operator. 3. Locate the [facility] provider you are looking for under [facility] and page to a number that you can be directly reached. 4. If you still have difficulty reaching the provider, please page the [facility] (Director on Call) for the Hospitalists listed on amion for assistance.   12/29/2018, 5:37 PM

## 2018-12-29 NOTE — ED Notes (Signed)
Pt is in triage room 2.

## 2018-12-29 NOTE — ED Triage Notes (Signed)
Pt c/o left sided chest pain, worse on inspiration that started yesterday. Denies fever/cough.

## 2018-12-29 NOTE — ED Notes (Signed)
Pt placed on hospital bed for comfort.

## 2018-12-30 ENCOUNTER — Other Ambulatory Visit: Payer: Self-pay

## 2018-12-30 DIAGNOSIS — E876 Hypokalemia: Secondary | ICD-10-CM | POA: Diagnosis not present

## 2018-12-30 LAB — BASIC METABOLIC PANEL
Anion gap: 9 (ref 5–15)
BUN: 11 mg/dL (ref 8–23)
CO2: 23 mmol/L (ref 22–32)
Calcium: 8 mg/dL — ABNORMAL LOW (ref 8.9–10.3)
Chloride: 105 mmol/L (ref 98–111)
Creatinine, Ser: 0.67 mg/dL (ref 0.61–1.24)
GFR calc Af Amer: >60 mL/min (ref >60)
GFR calc non Af Amer: >60 mL/min (ref >60)
Glucose, Bld: 94 mg/dL (ref 70–99)
Potassium: 3.2 mmol/L — ABNORMAL LOW (ref 3.5–5.1)
Sodium: 137 mmol/L (ref 135–145)

## 2018-12-30 LAB — CBC
HCT: 30.7 % — ABNORMAL LOW (ref 39.0–52.0)
Hemoglobin: 10.5 g/dL — ABNORMAL LOW (ref 13.0–17.0)
MCH: 34.7 pg — ABNORMAL HIGH (ref 26.0–34.0)
MCHC: 34.2 g/dL (ref 30.0–36.0)
MCV: 101.3 fL — ABNORMAL HIGH (ref 80.0–100.0)
Platelets: 171 10*3/uL (ref 150–400)
RBC: 3.03 MIL/uL — ABNORMAL LOW (ref 4.22–5.81)
RDW: 12.1 % (ref 11.5–15.5)
WBC: 3.9 10*3/uL — ABNORMAL LOW (ref 4.0–10.5)
nRBC: 0 % (ref 0.0–0.2)

## 2018-12-30 MED ORDER — POTASSIUM CHLORIDE ER 10 MEQ PO TBCR: 10.0000 meq | EXTENDED_RELEASE_TABLET | Freq: Every day | ORAL | 0 refills | Status: AC

## 2018-12-30 MED ORDER — POTASSIUM CHLORIDE CRYS ER 20 MEQ PO TBCR
40.0000 meq | EXTENDED_RELEASE_TABLET | ORAL | Status: AC
  Administered 2018-12-30 (×2): 40 meq via ORAL
  Filled 2018-12-30 (×2): qty 2

## 2018-12-30 MED ORDER — TRAMADOL HCL 50 MG PO TABS
50.0000 mg | ORAL_TABLET | Freq: Four times a day (QID) | ORAL | Status: DC
  Administered 2018-12-30: 50 mg via ORAL
  Filled 2018-12-30: qty 1

## 2018-12-30 MED ORDER — TRAMADOL HCL 50 MG PO TABS: 50.0000 mg | ORAL_TABLET | Freq: Four times a day (QID) | ORAL | Status: DC

## 2018-12-30 MED ORDER — OXYCODONE HCL 5 MG PO TABS
5.0000 mg | ORAL_TABLET | Freq: Once | ORAL | Status: AC
  Administered 2018-12-30: 5 mg via ORAL
  Filled 2018-12-30: qty 1

## 2018-12-30 NOTE — Discharge Summary (Signed)
Physician Discharge Summary  [name] [name] [id number] DOB: [birth year] DOA: 12/29/2018  PCP: Hoyt Koch, MD  Admit date: 12/29/2018 Discharge date: 12/30/2018  Admitted From: Home Disposition: Home  Recommendations for Outpatient Follow-up:  1. Follow up with PCP in 1-2 weeks 2. Please obtain BMP/CBC in one week 3. Please follow up on the following pending results:  Home Health: None Equipment/Devices: None  Discharge Condition: Stable CODE STATUS: Full code Diet recommendation: Cardiac  Subjective: Patient seen and examined.  No more complaints.  No more chest pain.  Brief/Interim Summary: [name] [name] is a 78 y.o. male with medical history significant of metastatic prostate CA and COPD presented with chest pain.  He been taking [place] and prednisone for his prostate CA for hormone-depletion therapy.  Upon arrival to ER, he was hemodynamically stable.  He was also found to have significant hypokalemia with potassium of 2.4.  He had chest wall tenderness on examination.  He was admitted to hospital service for chest pain and hypokalemia.  2 sets of troponin were obtained which were negative.  EKG did not show any acute ST-T wave changes.  Potassium was repleted.  Potassium is 3.2 again today.  He is going to get 2 more doses of 40 mEq oral potassium every 4 hours.  He is feeling much better today.  No more chest pain.  ACS ruled out.  He is stable so he will be discharged after second dose of potassium chloride today.  He will follow with PCP within 1 week and recheck potassium.  I am sending him on 10 mEq potassium chloride p.o. daily.  He is on prednisone and omeprazole which might be the culprit for his hypokalemia.    Discharge Diagnoses:  Principal Problem:   Hypokalemia Active Problems:   Left-sided chest wall pain   Prostate cancer metastatic to bone [facility])    Discharge Instructions  Discharge Instructions    Discharge patient   Complete by: As  directed    Discharge disposition: 01-Home or Self Care   Discharge patient date: 12/30/2018     Allergies as of 12/30/2018      Reactions   Latex Itching, Rash      Medication List    TAKE these medications   abiraterone acetate 250 MG tablet Commonly known as: ZYTIGA Take 1,000 mg by mouth daily.   Calcium 600+D Plus Minerals 600-400 MG-UNIT Tabs Take by mouth.   denosumab 60 MG/ML Sosy injection Commonly known as: PROLIA Inject 60 mg into the skin every 6 (six) months.   docusate sodium 100 MG capsule Commonly known as: COLACE Take 100 mg by mouth daily as needed for mild constipation.   Eye Vitamins & Minerals Tabs Take by mouth.   ibuprofen 800 MG tablet Commonly known as: ADVIL Take 800 mg by mouth daily as needed for moderate pain.   LUPRON DEPOT (107-MONTH) IM Inject 1 each into the muscle every 3 (three) months.   omeprazole 20 MG capsule Commonly known as: PRILOSEC Take 1 capsule (20 mg total) by mouth 2 (two) times daily before a meal. What changed: when to take this   potassium chloride 10 MEQ tablet Commonly known as: KLOR-CON Take 1 tablet (10 mEq total) by mouth daily for 10 days.   predniSONE 5 MG tablet Commonly known as: DELTASONE Take 5 mg by mouth daily.   tamsulosin 0.4 MG Caps capsule Commonly known as: FLOMAX Take 0.4 mg by mouth 2 (two) times daily.   traMADol 50 MG  tablet Commonly known as: ULTRAM Take 50 mg by mouth 4 (four) times daily. for pain   Vitamin D3 125 MCG (5000 UT) Caps Take 1 capsule by mouth daily.      Follow-up Information    Hoyt Koch, MD Follow up in 1 week(s).   Specialty: Internal Medicine Contact information: [place] [address] [phone]          Allergies  Allergen Reactions  . Latex Itching and Rash    Consultations: None   Procedures/Studies: Dg Chest 2 View  Result Date: 12/29/2018 CLINICAL DATA:  Chest pain EXAM: CHEST - 2 VIEW COMPARISON:   06/05/2017 FINDINGS: Chronic reticulation at the left base. Normal heart size and aortic contours with a superimposed moderate hiatal hernia. Large lung volumes. Artifact from EKG leads. There is no edema, consolidation, effusion, or pneumothorax. Spinal degeneration with scoliosis. Left second rib fracture has occurred since comparison but does not appear acute (there is spurring/mature periosteal reaction at the margins). IMPRESSION: 1. No acute finding when compared to prior. 2. Hiatal hernia and left base scarring. 3. Left posterior second rib fracture, likely nonacute. Electronically Signed   By: Monte Fantasia M.D.   On: 12/29/2018 04:57   Ct Angio Chest Pe W And/or Wo Contrast  Result Date: 12/29/2018 CLINICAL DATA:  Chest pain.  History of metastatic prostate cancer. EXAM: CT ANGIOGRAPHY CHEST WITH CONTRAST TECHNIQUE: Multidetector CT imaging of the chest was performed using the standard protocol during bolus administration of intravenous contrast. Multiplanar CT image reconstructions and MIPs were obtained to evaluate the vascular anatomy. CONTRAST:  31mL OMNIPAQUE IOHEXOL 350 MG/ML SOLN COMPARISON:  None. FINDINGS: Cardiovascular: Satisfactory opacification of the pulmonary arteries to the segmental level. No evidence of pulmonary embolism. Normal heart size. No pericardial effusion. Mild coronary artery calcifications are noted. Mediastinum/Nodes: Moderate size sliding-type hiatal hernia is noted. No adenopathy is noted. Thyroid gland is unremarkable. Lungs/Pleura: No pneumothorax or pleural effusion is noted. Minimal bilateral posterior basilar subsegmental atelectasis is noted. Upper Abdomen: No acute abnormality. Musculoskeletal: Sclerotic densities are noted in the visualized lumbar vertebral bodies, with probable old compression fracture of T12 which may be pathologic. Review of the MIP images confirms the above findings. IMPRESSION: No definite evidence of pulmonary embolus. Mild coronary  artery calcifications are noted. Moderate size sliding-type hiatal hernia. Sclerotic densities are noted in the visualized lumbar vertebral bodies concerning for osseous metastases. Probable old T12 compression fracture is noted which may be pathologic in origin. Electronically Signed   By: Marijo Conception M.D.   On: 12/29/2018 09:30      Discharge Exam: Vitals:   12/30/18 0745 12/30/18 0824  BP:  120/72  Pulse: 94 92  Resp: 18 18  Temp:  98.5 F (36.9 C)  SpO2:  96%   Vitals:   12/30/18 0517 12/30/18 0620 12/30/18 0745 12/30/18 0824  BP: 121/75   120/72  Pulse: 71  94 92  Resp: 16  18 18   Temp: 98.7 F (37.1 C)   98.5 F (36.9 C)  TempSrc: Oral   Oral  SpO2: 95%   96%  Height:  5\' 9"  (1.753 m)      General: Pt is alert, awake, not in acute distress Cardiovascular: RRR, S1/S2 +, no rubs, no gallops Respiratory: CTA bilaterally, no wheezing, no rhonchi Abdominal: Soft, NT, ND, bowel sounds + Extremities: no edema, no cyanosis    The results of significant diagnostics from this hospitalization (including imaging, microbiology, ancillary and laboratory)  are listed below for reference.     Microbiology: Recent Results (from the past 240 hour(s))  SARS CORONAVIRUS 2 (TAT 6-24 HRS) Nasopharyngeal Nasopharyngeal Swab     Status: None   Collection Time: 12/29/18 11:37 AM   Specimen: Nasopharyngeal Swab  Result Value Ref Range Status   SARS Coronavirus 2 NEGATIVE NEGATIVE Final    Comment: (NOTE) SARS-CoV-2 target nucleic acids are NOT DETECTED. The SARS-CoV-2 RNA is generally detectable in upper and lower respiratory specimens during the acute phase of infection. Negative results do not preclude SARS-CoV-2 infection, do not rule out co-infections with other pathogens, and should not be used as the sole basis for treatment or other patient management decisions. Negative results must be combined with clinical observations, patient history, and epidemiological information.  The expected result is Negative. Fact Sheet for Patients: SugarRoll.be Fact Sheet for Healthcare Providers: https://www.woods-mathews.com/ This test is not yet approved or cleared by the [place] FDA and  has been authorized for detection and/or diagnosis of SARS-CoV-2 by FDA under an Emergency Use Authorization (EUA). This EUA will remain  in effect (meaning this test can be used) for the duration of the COVID-19 declaration under Section 56 4(b)(1) of the Act, 21 U.S.C. section 360bbb-3(b)(1), unless the authorization is terminated or revoked sooner. Performed at [facility], [place] [address], [place], [place] [address]      Labs: BNP (last 3 results) No results for input(s): BNP in the last 8760 hours. Basic Metabolic Panel: Recent Labs  Lab 12/29/18 0430 12/29/18 0830 12/29/18 1621 12/29/18 1638 12/30/18 0452  NA 136 137 137  --  137  K 2.6* 2.4* 3.5  --  3.2*  CL 97* 97* 101  --  105  CO2 29 29 27   --  23  GLUCOSE 106* 105* 112*  --  94  BUN 15 14 12   --  11  CREATININE 0.77 0.76 0.66  --  0.67  CALCIUM 9.2 8.9 8.4*  --  8.0*  MG  --   --   --  1.9  --    Liver Function Tests: No results for input(s): AST, ALT, ALKPHOS, BILITOT, PROT, ALBUMIN in the last 168 hours. No results for input(s): LIPASE, AMYLASE in the last 168 hours. No results for input(s): AMMONIA in the last 168 hours. CBC: Recent Labs  Lab 12/29/18 0430 12/30/18 0452  WBC 4.0 3.9*  HGB 11.8* 10.5*  HCT 34.5* 30.7*  MCV 102.1* 101.3*  PLT 190 171   Cardiac Enzymes: No results for input(s): CKTOTAL, CKMB, CKMBINDEX, TROPONINI in the last 168 hours. BNP: Invalid input(s): POCBNP CBG: No results for input(s): GLUCAP in the last 168 hours. D-Dimer No results for input(s): DDIMER in the last 72 hours. Hgb A1c No results for input(s): HGBA1C in the last 72 hours. Lipid Profile No results for input(s): CHOL, HDL, LDLCALC, TRIG, CHOLHDL,  LDLDIRECT in the last 72 hours. Thyroid function studies No results for input(s): TSH, T4TOTAL, T3FREE, THYROIDAB in the last 72 hours.  Invalid input(s): FREET3 Anemia work up No results for input(s): VITAMINB12, FOLATE, FERRITIN, TIBC, IRON, RETICCTPCT in the last 72 hours. Urinalysis    Component Value Date/Time   COLORURINE YELLOW 12/23/2016 1105   APPEARANCEUR [place] (A) 12/23/2016 1105   LABSPEC 1.020 12/23/2016 1105   PHURINE 7.0 12/23/2016 1105   GLUCOSEU NEGATIVE 12/23/2016 1105   HGBUR MODERATE (A) 12/23/2016 1105   BILIRUBINUR 3+   4mg  01/01/2017 [place] 12/23/2016 1105  PROTEINUR 15 mg 01/01/2017 1424   UROBILINOGEN 2.0 (A) 01/01/2017 1424   UROBILINOGEN 0.2 12/23/2016 1105   NITRITE pos 01/01/2017 1424   NITRITE NEGATIVE 12/23/2016 1105   LEUKOCYTESUR Large (3+) (A) 01/01/2017 1424   Sepsis Labs Invalid input(s): PROCALCITONIN,  WBC,  LACTICIDVEN Microbiology Recent Results (from the past 240 hour(s))  SARS CORONAVIRUS 2 (TAT 6-24 HRS) Nasopharyngeal Nasopharyngeal Swab     Status: None   Collection Time: 12/29/18 11:37 AM   Specimen: Nasopharyngeal Swab  Result Value Ref Range Status   SARS Coronavirus 2 NEGATIVE NEGATIVE Final    Comment: (NOTE) SARS-CoV-2 target nucleic acids are NOT DETECTED. The SARS-CoV-2 RNA is generally detectable in upper and lower respiratory specimens during the acute phase of infection. Negative results do not preclude SARS-CoV-2 infection, do not rule out co-infections with other pathogens, and should not be used as the sole basis for treatment or other patient management decisions. Negative results must be combined with clinical observations, patient history, and epidemiological information. The expected result is Negative. Fact Sheet for Patients: SugarRoll.be Fact Sheet for Healthcare Providers: https://www.woods-mathews.com/ This test is not yet approved or  cleared by the [place] FDA and  has been authorized for detection and/or diagnosis of SARS-CoV-2 by FDA under an Emergency Use Authorization (EUA). This EUA will remain  in effect (meaning this test can be used) for the duration of the COVID-19 declaration under Section 56 4(b)(1) of the Act, 21 U.S.C. section 360bbb-3(b)(1), unless the authorization is terminated or revoked sooner. Performed at [facility], [place] [address], [place], [place] [address]      Time coordinating discharge: Over 30 minutes  SIGNED:   Darliss Cheney, MD  [facility] 12/30/2018, 8:40 AM  If 7PM-7AM, please contact night-coverage [email or web address] Password [email or web address]

## 2018-12-30 NOTE — Discharge Instructions (Signed)

## 2018-12-30 NOTE — Progress Notes (Signed)
DISCHARGE NOTE HOME [name] [name] to be discharged Home per MD order. Discussed prescriptions and follow up appointments with the patient. Prescriptions given to patient; medication list explained in detail. Patient verbalized understanding.  Skin clean, dry and intact without evidence of skin break down, no evidence of skin tears noted. IV catheter discontinued intact. Site without signs and symptoms of complications. Dressing and pressure applied. Pt denies pain at the site currently. No complaints noted.  Patient free of lines, drains, and wounds.   An After Visit Summary (AVS) was printed and given to the patient. Patient escorted via wheelchair, and discharged home via private auto.  Paulla Fore, RN

## 2018-12-30 NOTE — Progress Notes (Signed)
New Admission Note:  Arrival Method: Stretcher from ED Mental Orientation:  A&Ox4 Telemetry: Box #18 Assessment: Completed Skin: Intact IV: PIV L AC Pain: 0/10 Tubes: None Safety Measures: Safety Fall Prevention Plan was discussed  Admission: Completed Belongings: Clothing in bag at bedside, Glasses and cell phone on BS table Unit Orientation: Patient has been orientated to the room, unit, and the staff.  Orders have been reviewed and implemented. Call light has been placed within reach and bed alarm has been activated. Will continue to monitor the patient.  Percell Boston, RN

## 2018-12-31 ENCOUNTER — Telehealth: Payer: Self-pay | Admitting: *Deleted

## 2018-12-31 NOTE — Telephone Encounter (Signed)
Transition Care Management Follow-up Telephone Call   Date discharged? 12/30/18   How have you been since you were released from the hospital? Pt states he is doing well   Do you understand why you were in the hospital? YES   Do you understand the discharge instructions? YES   Where were you discharged to? Home   Items Reviewed:  Medications reviewed: YES, currently taking the potassium that was rx  Allergies reviewed: YES  Dietary changes reviewed: YES, heart healthy  Referrals reviewed: No referral recommeded   Functional Questionnaire:   Activities of Daily Living (ADLs):   He states he are independent in the following: ambulation, bathing and hygiene, feeding, continence, grooming, toileting and dressing States he is able to get around without assistance    Any transportation issues/concerns?: NO   Any patient concerns? NO   Confirmed importance and date/time of follow-up visits scheduled YES, appt 01/11/19  Provider Appointment booked with Dr. Sharlet Salina  Confirmed with patient if condition begins to worsen call PCP or go to the ER.  Patient was given the office number and encouraged to call back with question or concerns.  : YES

## 2019-01-04 ENCOUNTER — Telehealth: Payer: Self-pay | Admitting: Internal Medicine

## 2019-01-04 NOTE — Telephone Encounter (Signed)
Patient informed of MD response and stated understanding  

## 2019-01-04 NOTE — Telephone Encounter (Signed)
Pt has an appt on 01/11/2019 and he has been constipated for one week and has tried miralax started last night and directions said it can take up to 3 days. Pt has not had any relief yet.. Pt is on potassium pill and side effect said can cause constipation. Pt said the issue can be resolve with a phone call. Pt would like to know if he should continue miralax for 2 more days or stop potassium pill

## 2019-01-04 NOTE — Telephone Encounter (Signed)
Can continue miralax. Okay to do phone visit if he desires more guidance. Increase fluids and consider fruit juice or prune juice also to help.

## 2019-01-06 DIAGNOSIS — C61 Malignant neoplasm of prostate: Principal | ICD-10-CM

## 2019-01-06 DIAGNOSIS — C7951 Secondary malignant neoplasm of bone: Principal | ICD-10-CM

## 2019-01-07 ENCOUNTER — Ambulatory Visit: Admit: 2019-01-07 | Discharge: 2019-01-10 | Payer: MEDICARE

## 2019-01-10 ENCOUNTER — Other Ambulatory Visit: Admit: 2019-01-10 | Discharge: 2019-01-11 | Payer: MEDICARE

## 2019-01-10 ENCOUNTER — Ambulatory Visit: Admit: 2019-01-10 | Discharge: 2019-01-11 | Payer: MEDICARE | Attending: Internal Medicine | Primary: Internal Medicine

## 2019-01-10 DIAGNOSIS — C7951 Secondary malignant neoplasm of bone: Secondary | ICD-10-CM

## 2019-01-10 DIAGNOSIS — C61 Malignant neoplasm of prostate: Principal | ICD-10-CM

## 2019-01-10 IMAGING — DX DG ABDOMEN 1V
1 series · 1 of 1 positions shown · non-contrast
Comparison: None.

CLINICAL DATA: Abdominal pain with constipation. History of
prostate carcinoma

EXAM:
ABDOMEN - 1 VIEW

[abdomen kub]
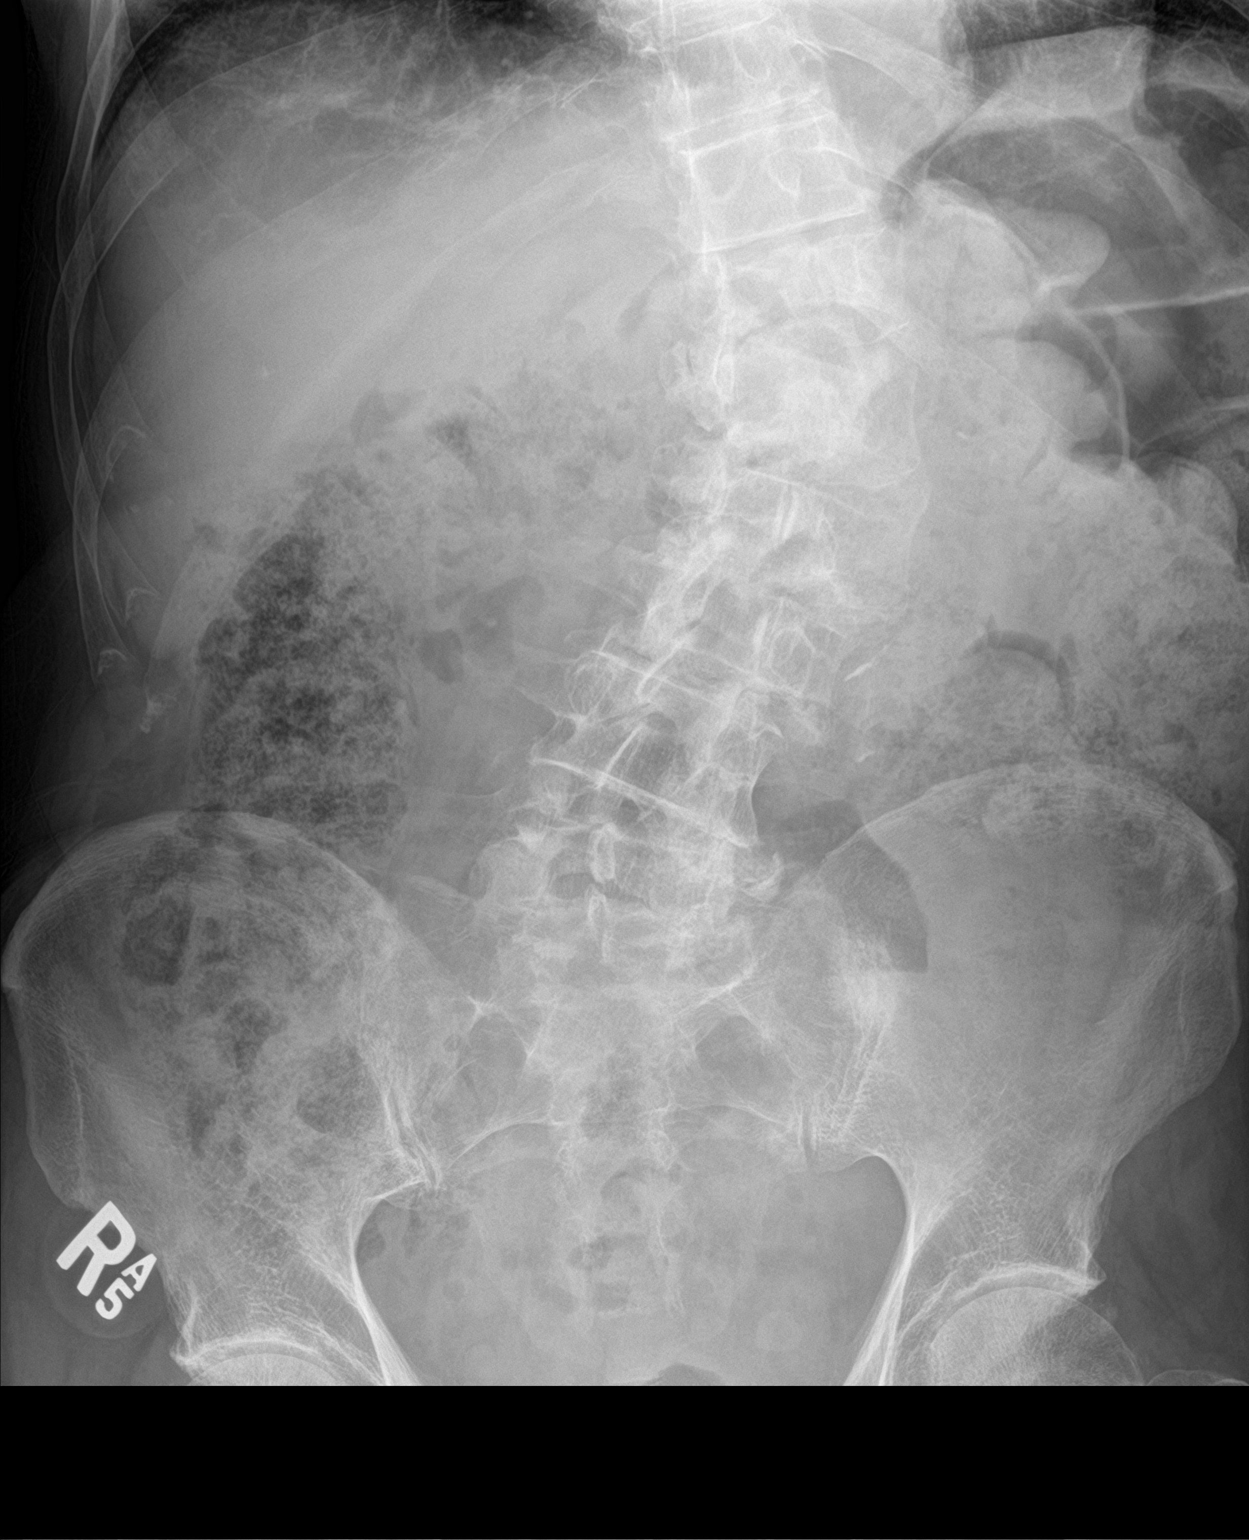

[1 of 1 positions shown; findings below may reference images not displayed]

FINDINGS: There is diffuse stool throughout the colon. There is no bowel
dilatation or air-fluid level to suggest bowel obstruction. No free
air. There is thoracolumbar levoscoliosis with rotatory component.
No blastic or lytic bone lesions are demonstrable. There is aortic
atherosclerosis.
IMPRESSION: Diffuse stool throughout colon with appearance indicative of a
degree of constipation. No bowel obstruction or free air.

There is aortic atherosclerosis.

There is thoracolumbar levorotoscoliosis.

Aortic Atherosclerosis ([id number]).

## 2019-01-11 ENCOUNTER — Encounter: Payer: Self-pay | Admitting: Internal Medicine

## 2019-01-11 ENCOUNTER — Other Ambulatory Visit: Payer: Self-pay

## 2019-01-11 ENCOUNTER — Ambulatory Visit (INDEPENDENT_AMBULATORY_CARE_PROVIDER_SITE_OTHER): Payer: Medicare HMO | Admitting: Internal Medicine

## 2019-01-11 VITALS — BP 100/64 | HR 92 | Temp 98.0°F | Ht 69.0 in | Wt 130.0 lb

## 2019-01-11 DIAGNOSIS — E876 Hypokalemia: Secondary | ICD-10-CM | POA: Diagnosis not present

## 2019-01-11 DIAGNOSIS — C7951 Secondary malignant neoplasm of bone: Secondary | ICD-10-CM

## 2019-01-11 DIAGNOSIS — C61 Malignant neoplasm of prostate: Secondary | ICD-10-CM | POA: Diagnosis not present

## 2019-01-11 MED ORDER — ZOLPIDEM TARTRATE 10 MG PO TABS: 10.0000 mg | ORAL_TABLET | Freq: Every day | ORAL | 3 refills | Status: AC

## 2019-01-11 NOTE — Patient Instructions (Addendum)
We have sent in [place] for the sleep which should help.   Let us know if you need the hospital bed.

## 2019-01-11 NOTE — Progress Notes (Signed)
   Subjective:   Patient ID: [name] [name], male    DOB: 02/18/1941, 78 y.o.   MRN: [id number]  HPI The patient is an 78 YO man coming in for hospital follow up (in for chest pain and low potassium, s/p replacement and stopping medication with heme/onc which was likely causing this, has taken oral supplement since being home). He is overall doing okay. Having problems with sleeping at night due to pain and need to reposition frequently due to his metastatic cancer. He was given something in the hospital which helped him to sleep well. He would like to start something for sleep as he is rarely able to sleep even 3 hours a night which is a huge problem in his QOL right now. He is amenable to hospice but does not feel that is needed yet. Would like to die at home. Would not want heroic measures. Denies cough or SOB or chest pain. Denies diarrhea or constipation. Denies leg swelling. Has bone pain from the cancer and has stopped his medication per oncology given the low potassium and chest pain. This is not recurrent since stopping medication. Recent labs with heme/onc yesterday.  PMH, [facility], social history reviewed and updated  Due to current medical condition of metastatic prostate cancer to bone the patient requires positioning of the body in ways not feasible with an ordinary bed. Pillows and wedges have been considered and ruled out. Patient requires a bed height different than a fixed height hospital bed to permit transfers. The patient also requires frequent changes in body position due to their condition.   Review of Systems  Constitutional: Positive for activity change and unexpected weight change.  HENT: Negative.   Eyes: Negative.   Respiratory: Negative for cough, chest tightness and shortness of breath.   Cardiovascular: Negative for chest pain, palpitations and leg swelling.  Gastrointestinal: Negative for abdominal distention, abdominal pain, constipation, diarrhea, nausea and vomiting.   Musculoskeletal: Positive for arthralgias and myalgias.  Skin: Negative.   Neurological: Negative.   Psychiatric/Behavioral: Positive for sleep disturbance.    Objective:  Physical Exam Constitutional:      Appearance: He is well-developed.     Comments: Thin  HENT:     Head: Normocephalic and atraumatic.  Neck:     Musculoskeletal: Normal range of motion.  Cardiovascular:     Rate and Rhythm: Normal rate and regular rhythm.  Pulmonary:     Effort: Pulmonary effort is normal. No respiratory distress.     Breath sounds: Normal breath sounds. No wheezing or rales.  Abdominal:     General: Bowel sounds are normal. There is no distension.     Palpations: Abdomen is soft.     Tenderness: There is no abdominal tenderness. There is no rebound.  Skin:    General: Skin is warm and dry.  Neurological:     Mental Status: He is alert and oriented to person, place, and time.     Coordination: Coordination normal.     Vitals:   01/11/19 1412  BP: 100/64  Pulse: 92  Temp: 98 F (36.7 C)  TempSrc: Oral  SpO2: 99%  Weight: 130 lb (59 kg)  Height: 5\' 9"  (1.753 m)    Assessment & Plan:

## 2019-01-12 ENCOUNTER — Encounter: Payer: Self-pay | Admitting: Internal Medicine

## 2019-01-12 DIAGNOSIS — C61 Malignant neoplasm of prostate: Principal | ICD-10-CM

## 2019-01-12 DIAGNOSIS — C7951 Secondary malignant neoplasm of bone: Principal | ICD-10-CM

## 2019-01-12 NOTE — Assessment & Plan Note (Signed)
Much improved with recent labs from heme/onc with K 3.2.

## 2019-01-12 NOTE — Assessment & Plan Note (Addendum)
Due to bone pain in the evening and night he has a hard time with sleeping. Rx ambien 10 mg daily to try for sleep and if no results we can try something else. If he is unable to get relief at night time to help sleep he will need hospital bed. Documentation done and appropriate for that at this time.

## 2019-01-14 ENCOUNTER — Ambulatory Visit: Admit: 2019-01-14 | Discharge: 2019-01-15 | Payer: MEDICARE

## 2019-01-14 MED ORDER — DEXAMETHASONE 4 MG TABLET
ORAL_TABLET | ORAL | 6 refills | 42 days | Status: CP
Start: 2019-01-14 — End: ?

## 2019-01-14 MED ORDER — PREDNISONE 5 MG TABLET
ORAL_TABLET | Freq: Every day | ORAL | 6 refills | 30.00000 days | Status: CP
Start: 2019-01-14 — End: ?

## 2019-01-14 MED ORDER — PROCHLORPERAZINE MALEATE 10 MG TABLET
ORAL_TABLET | Freq: Four times a day (QID) | ORAL | prn refills | 8.00000 days | Status: CP | PRN
Start: 2019-01-14 — End: ?

## 2019-01-17 MED ORDER — TRAMADOL 50 MG TABLET
ORAL_TABLET | 0 refills | 0 days | Status: CP
Start: 2019-01-17 — End: ?

## 2019-01-26 ENCOUNTER — Other Ambulatory Visit: Payer: Self-pay

## 2019-01-26 DIAGNOSIS — Z20822 Contact with and (suspected) exposure to covid-19: Secondary | ICD-10-CM

## 2019-01-27 LAB — NOVEL CORONAVIRUS, NAA: SARS-CoV-2, NAA: NOT DETECTED

## 2019-02-03 ENCOUNTER — Telehealth: Admit: 2019-02-03 | Discharge: 2019-02-04 | Payer: MEDICARE | Attending: Internal Medicine | Primary: Internal Medicine

## 2019-02-03 MED ORDER — OXYCODONE 5 MG TABLET: 5 mg | tablet | Freq: Four times a day (QID) | 0 refills | 15 days | Status: AC

## 2019-02-03 MED ORDER — OXYCODONE 5 MG TABLET
ORAL_TABLET | Freq: Four times a day (QID) | ORAL | 0 refills | 15.00000 days | Status: CP | PRN
Start: 2019-02-03 — End: 2019-02-03

## 2019-02-04 ENCOUNTER — Ambulatory Visit: Admit: 2019-02-04 | Discharge: 2019-02-05 | Payer: MEDICARE

## 2019-02-18 MED ORDER — TRAMADOL 50 MG TABLET
ORAL_TABLET | 0 refills | 0 days | Status: CP
Start: 2019-02-18 — End: ?

## 2019-02-21 ENCOUNTER — Encounter: Payer: Self-pay | Admitting: Internal Medicine

## 2019-02-28 ENCOUNTER — Ambulatory Visit: Admit: 2019-02-28 | Discharge: 2019-02-28 | Payer: MEDICARE

## 2019-02-28 ENCOUNTER — Telehealth: Admit: 2019-02-28 | Discharge: 2019-02-28 | Payer: MEDICARE | Attending: Internal Medicine | Primary: Internal Medicine

## 2019-02-28 DIAGNOSIS — G893 Neoplasm related pain (acute) (chronic): Principal | ICD-10-CM

## 2019-02-28 DIAGNOSIS — C61 Malignant neoplasm of prostate: Principal | ICD-10-CM

## 2019-02-28 DIAGNOSIS — C7951 Secondary malignant neoplasm of bone: Secondary | ICD-10-CM

## 2019-03-15 MED ORDER — TRAMADOL 50 MG TABLET
ORAL_TABLET | 0 refills | 0 days | Status: CP
Start: 2019-03-15 — End: ?

## 2019-03-19 ENCOUNTER — Ambulatory Visit
Admit: 2019-03-19 | Discharge: 2019-03-20 | Payer: MEDICARE | Attending: Infectious Disease | Primary: Infectious Disease

## 2019-03-22 ENCOUNTER — Ambulatory Visit: Admit: 2019-03-22 | Discharge: 2019-04-20 | Payer: MEDICARE

## 2019-03-22 ENCOUNTER — Ambulatory Visit: Admit: 2019-03-22 | Discharge: 2019-04-04 | Payer: MEDICARE

## 2019-03-22 ENCOUNTER — Ambulatory Visit: Admit: 2019-03-22 | Discharge: 2019-03-22 | Payer: MEDICARE

## 2019-03-22 DIAGNOSIS — C61 Malignant neoplasm of prostate: Principal | ICD-10-CM

## 2019-03-22 DIAGNOSIS — C7951 Secondary malignant neoplasm of bone: Principal | ICD-10-CM

## 2019-03-24 ENCOUNTER — Ambulatory Visit: Admit: 2019-03-24 | Discharge: 2019-03-25 | Payer: MEDICARE

## 2019-03-24 ENCOUNTER — Telehealth: Admit: 2019-03-24 | Discharge: 2019-03-25 | Payer: MEDICARE | Attending: Internal Medicine | Primary: Internal Medicine

## 2019-03-24 DIAGNOSIS — C61 Malignant neoplasm of prostate: Principal | ICD-10-CM

## 2019-03-24 DIAGNOSIS — C7951 Secondary malignant neoplasm of bone: Principal | ICD-10-CM

## 2019-03-24 MED ORDER — ENZALUTAMIDE 40 MG CAPSULE
ORAL_CAPSULE | Freq: Every day | ORAL | 6 refills | 40 days | Status: CP
Start: 2019-03-24 — End: ?
  Filled 2019-03-31: qty 90, 30d supply, fill #0

## 2019-03-25 DIAGNOSIS — C7951 Secondary malignant neoplasm of bone: Principal | ICD-10-CM

## 2019-03-25 DIAGNOSIS — C61 Malignant neoplasm of prostate: Principal | ICD-10-CM

## 2019-03-29 NOTE — Unmapped (Signed)
[facility]   Patient Onboarding/Medication Counseling    Mr.Lausch is a 79 y.o. male with prostate cancer metastatic to bone who I am counseling today on initiation of therapy.  I am speaking to the patient.    Was a Nurse, learning disability used for this call? No    Verified patient's date of birth / HIPAA.    Specialty medication(s) to be sent: Hematology/Oncology: Diana Eves      Non-specialty medications/supplies to be sent: none      Medications not needed at this time: none         Xtandi (Enzalutamide)    Medication & Administration     Dosage: Take 3 capsules (120 mg) by mouth daily    Administration:   ? Take at same time each day  ? Take with or without food  ? Take with calcium and vitamin D  ? Swallow capsules whole; do not chew, dissolve, or open the capsules.    Adherence/Missed dose instructions:  ? Take a missed dose as soon as you think about it.  ? If you do not think about the missed dose until the next day, skip the missed dose and go back to your normal time.  ? Do not take 2 doses at the same time or extra doses    Goals of Therapy     ? Prevent disease progression    Side Effects & Monitoring Parameters     ? Common side effects  ? Feeling dizzy, tired, or weak.  ? Back, muscle, or joint pain.  ? Muscle weakness.  ? Not hungry.  ? Diarrhea or constipation.  ? Hot flashes.  ? Signs of a common cold.  ? Weight loss.  ? Headache.  ? Trouble sleeping.  ? Anxiety.  ? Change in taste    ? The following side effects should be reported to the provider:  ? Allergic reaction  ? Signs of high blood pressure (very bad headache or dizziness, passing out, or change in eyesight)  ? Signs of electrolyte problems (mood changes, confusion, muscle pain or weakness, abnormal heartbeat, seizures, not hungry, or very bad upset stomach or throwing up)  ? Chest pain or pressure or shortness of breath  ? Any abnormal burning, numbness, or tingling feeling  ? Blood in the urine.  ? Seizures  ? Swelling in the arms or legs  ? Bone pain after a fall  ? Memory problems or loss or not able to focus  ? Signs of infection   ? Signs of a brain problem called posterior reversible encephalopathy syndrome (PRES) (feeling confused, lowered alertness, change in eyesight, loss of eyesight, seizures, or very bad headache)    ? Monitoring Parameters:   ? Monitor blood pressure    Contraindications, Warnings, & Precautions     ? Male patients with male partners of reproductive potential should use effective contraception during treatment and for 3 months after the last dose  ? Cardiovascular events, specifically ischemic heart disease  ? Fractures - evaluate fall and fracture risk  ? Posterior reversible encephalopathy syndrome - monitor for headache, seizure, confusion, and visual changes  ? Seizures     Drug/Food Interactions     ? Medication list reviewed in Epic. The patient was instructed to inform the care team before taking any new medications or supplements. Enzalutamide may decrease serum concentrations of tamsulosin, tramadol, omeprazole, dexamethasone and prednisone.     Storage, Handling Precautions, & Disposal     ?  Store at room temperature in a dry place  ? Keep in a safe place away from children and pets  ? This drug is considered hazardous and should be handled as little as possible.  If someone else helps with medication administration, they should wear gloves.    Current Medications (including OTC/herbals), Comorbidities and Allergies     Current Outpatient Medications   Medication Sig Dispense Refill   ??? ascorbic acid (VITAMIN C ORAL) Take 2,000 Units by mouth daily.     ??? ascorbic acid, vitamin C, (VITAMIN C) 100 MG tablet Take 100 mg by mouth daily.     ??? calcium carbonate-vit D3-min 600 mg calcium- 400 unit Tab Take 1 tablet by mouth daily.      ??? cholecalciferol, vitamin D3, (VITAMIN D3) 5,000 unit tablet Take 5,000 Units by mouth daily.      ??? dexAMETHasone (DECADRON) 4 MG tablet Take 2 tablets (8 mg total) by mouth every twenty-one (21) days. Take 12 hours and 3 hours prior to chemotherapy. 4 tablet 6   ??? enzalutamide (XTANDI) 40 mg capsule Take 3 capsules (120 mg total) by mouth daily. 120 capsule 6   ??? EYE VITAMIN AND MINERALS 7,160-113-100 unit-mg-unit Tab Take 1 capsule by mouth daily.     ??? ibuprofen (MOTRIN) 800 MG tablet Take 1 tablet by mouth twice daily as needed for pain 60 tablet 11   ??? omeprazole (PRILOSEC) 20 MG capsule TAKE 1 CAPSULE (20 MG TOTAL) BY MOUTH DAILY. 90 capsule 3   ??? predniSONE (DELTASONE) 5 MG tablet Take 1 tablet (5 mg total) by mouth daily. 30 tablet 6   ??? prochlorperazine (COMPAZINE) 10 MG tablet Take 1 tablet (10 mg total) by mouth every six (6) hours as needed (As needed for nausea). 30 tablet prn   ??? tamsulosin (FLOMAX) 0.4 mg capsule TAKE 1 CAPSULE TWICE DAILY 180 capsule 3   ??? traMADoL (ULTRAM) 50 mg tablet TAKE 1 TABLET BY MOUTH EVERY 6 HOURS AS NEEDED 120 tablet 0     Current Facility-Administered Medications   Medication Dose Route Frequency Provider Last Rate Last Admin   ??? denosumab (PROLIA) injection 60 mg  60 mg Subcutaneous Q6 Months [employer], MD   60 mg at 02/05/18 1637       Allergies   Allergen Reactions   ??? Latex Itching and Rash       Patient Active Problem List   Diagnosis   ??? Levoscoliosis   ??? Routine health maintenance   ??? Gastroesophageal reflux disease without esophagitis   ??? 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 Closed wedge compression fracture of twelfth thoracic vertebra (CMS-HCC)   ??? Pathologic compression fracture of spine (CMS-HCC)   ??? 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 Encounter for peripheral line removal       Reviewed and up to date in Epic.    Appropriateness of Therapy     Is medication and dose appropriate based on diagnosis? Yes    Prescription has been clinically reviewed: Yes    Baseline Quality of Life Assessment      How many days over the past month did your prostate cancer keep you from your normal activities? 0    Financial Information     Medication Assistance provided: Prior Authorization and Psychologist, clinical    Anticipated copay of $0 / 30 DAYS reviewed with patient. Verified delivery address.    Delivery Information     Scheduled delivery  date: 04/01/19    Expected start date: 04/07/19    Medication will be delivered via UPS to the prescription address in [facility].  This shipment will not require a signature.      Explained the services we provide at [facility] and that each month we would call to set up refills.  Stressed importance of returning phone calls so that we could ensure they receive their medications in time each month.  Informed patient that we should be setting up refills 7-10 days prior to when they will run out of medication.  A pharmacist will reach out to perform a clinical assessment periodically.  Informed patient that a welcome packet and a drug information handout will be sent.      Patient verbalized understanding of the above information as well as how to contact the pharmacy at [phone] option 4 with any questions/concerns.  The pharmacy is open Monday through Friday 8:30am-4:30pm.  A pharmacist is available 24/7 via pager to answer any clinical questions they may have.    Patient Specific Needs     ? Does the patient have any physical, cognitive, or cultural barriers? No    ? Patient prefers to have medications discussed with  Patient     ? Is the patient or caregiver able to read and understand education materials at a high school level or above? Yes    ? Patient's primary language is  English     ? Is the patient high risk? Yes, patient is taking oral chemotherapy. Appropriateness of therapy has been assessed.     ? Does the patient require a Care Management Plan? No     ? Does the patient require physician intervention or other additional services (i.e. nutrition, smoking cessation, social work)? No      Jarely Juncaj [facility] Specialty Pharmacist

## 2019-03-29 NOTE — Unmapped (Signed)
[facility] Specialty Medication Onboarding    Specialty Medication: Wendi Maya  Prior Authorization: Approved   Financial Assistance: Yes - grant approved as secondary   Final Copay/Day Supply: $0 / 30 DAYS    Insurance Restrictions: Yes - max 1 month supply     Notes to Pharmacist:     The triage team has completed the benefits investigation and has determined that the patient is able to fill this medication at [facility]. Please contact the patient to complete the onboarding or follow up with the prescribing physician as needed.

## 2019-03-30 ENCOUNTER — Ambulatory Visit
Admit: 2019-03-30 | Discharge: 2019-04-03 | Payer: MEDICARE | Attending: Radiation Oncology | Primary: Radiation Oncology

## 2019-03-31 MED FILL — XTANDI 40 MG CAPSULE: 30 days supply | Qty: 90 | Fill #0 | Status: AC

## 2019-03-31 NOTE — Unmapped (Signed)
RADIATION ONCOLOGY FOLLOW-UP VISIT NOTE     Encounter Date: 03/30/2019  Patient Name: Seth Mcguire [facility]  Medical Record Number: [id number]    DIAGNOSIS:  79yo with metastatic prostate cancer with a painful lesion in the mid back/L posterior rib.    PRIOR RT COURSES:  04/05/2018: R 7th rib (4Gy x 5)  08/27/2018: Sacrum (4Gy x 5)    ASSESSMENT:  Disease Status: Metastatic disease    RECOMMENDATIONS:  We discussed the etiology of his worsening mid-back pain.  We discussed his imaging demonstrates fairly diffuse metastatic disease and there are certainly foci of cancer in his posterior ribs at the site of his discomfort.  However, in this area he also has disease in his spine as well as a pathologic fracture at T12 (although this was previously noted on other scans and he was not having pain at that time).  I discussed that if his pain is due to a bone metastasis then radiation could be beneficial in improving his pain.  However, if the pain is related to fracture or another benign etiology then radiation would not help.  He would like to proceed with a course of radiation to see if it helps address his pain.  I would plan to treat a fairly generous area given the uncertainty of the target.  We discussed the logistics of RT including daily treatment M-F for 1-2 weeks- he has previously had a good response to 4Gy x 5 and we can likely repeat this depending on size of field and normal tissue structures.  Side effects could include fatigue, skin irritation, nausea, diarrhea, pain flare.  We discussed his kidneys are likely adjacent to the target and there is a chance of renal dysfunction.  He would like to proceed with treatment and signed/witnessed consent was obtained.  He has an appointment in [facility] with Dr. Okey Dupre on 2/8 and would like to do CT sim on this day for convenience- he will notify me if the pain worsens and we can move up the CT sim.  RT would start ~1 week after this.    INTERVAL HISTORY:    We reviewed his clinical course to date.  Over the past month he has developed new pain over the midline/L mid-back.  The pain has been worsening and has been causing problems with sleep.  He is taking tramadol and ibuprofen- the combination of these 2 medications help control it most of the time.  He does have other mildly painful sites but this is his most symptomatic site.  From a prostate cancer perspective he has been receiving docetaxel recently and has previously been treated with abi/pred and radium-223 as well as 2 prior courses of palliative RT.  He did have imaging recently as follows:    03/22/2019- CT C/A/P:  Innumerable metastatic lesions throughout the axial skeleton, some of which demonstrate increased sclerosis compared to prior study. Severe levoscoliosis of the lumbar spine. Unchanged T12 and S1 pathologic compression fractures. Multiple pathologic fracture of the L2 vertebral body with extension into the spinal canal at this level, similar to prior.  Increasing extraosseous soft tissue component of the right posterior fifth rib metastatic lesion.    03/22/2019- NM Bone scan:  Diffuse osseous metastatic disease.  There is new uptake in the midline posterior skull and the right mandible. The foci of uptake in anterior skull near the zygomatic arch are unchanged. There are multiple new foci of uptake in the bilateral ribs. Increased uptake in the region of  previously seen proximal right humerus with additional site more proximal.   Unchanged uptake in the spine, pelvis, sternum, and proximal femur    REVIEW OF SYSTEMS:  A comprehensive review of 10 systems was negative except for pertinent positives noted in HPI.    PAST MEDICAL HISTORY/FAMILY HISTORY/SOCIAL HISTORY:  Reviewed in EPIC    ALLERGIES/MEDICATIONS:  Reviewed in EPIC    PHYSICAL EXAM:  Vital Signs for this encounter:   BP 83/52  - Pulse 110  - Temp 36 ??C (96.8 ??F) (Temporal)  - Wt 55.3 kg (121 lb 14.4 oz)  - SpO2 98%  - BMI 19.09 kg/m??   Karnofsky/Lansky Performance Status: 60, Requires occasional assistance, but is able to care for most of his personal needs (ECOG equivalent 2)  General:   No acute distress, alert and oriented X 4   Head: Normocephalic, without obvious abnormality, atraumatic  Eyes: EOMI, no scleral icterus  Lungs: clear to auscultation bilaterally  Abdomen: soft, non-tender; bowel sounds normal; no masses,  no organomegaly  Extremities: extremities normal, atraumatic, no cyanosis or edema   Back:  Mild tenderness to palpation roughly at level of T12 to the L of the vertebral body.  He also has mild tenderness to palpation in the mid T-spine.  Lymph nodes: No palpable cervical lymphadenopathy  Neurologic: Grossly normal      RADIOLOGY:  Radiology as reviewed in the HPI- I have personally reviewed the imaging    Labs:    No results found for: WBC, HGB, HCT, PLT, LDH, CREATININE, AST, ALT, MG    Rayetta Humphrey, MD  Assistant Professor  [facility] Dept of Radiation Oncology  03/30/2019

## 2019-03-31 NOTE — Unmapped (Signed)
Consent for treatment witnessed

## 2019-04-09 ENCOUNTER — Ambulatory Visit: Admit: 2019-04-09 | Discharge: 2019-04-10 | Payer: MEDICARE

## 2019-04-10 DIAGNOSIS — C61 Malignant neoplasm of prostate: Principal | ICD-10-CM

## 2019-04-10 DIAGNOSIS — C7951 Secondary malignant neoplasm of bone: Principal | ICD-10-CM

## 2019-04-11 ENCOUNTER — Other Ambulatory Visit: Admit: 2019-04-11 | Discharge: 2019-04-12 | Payer: MEDICARE

## 2019-04-11 ENCOUNTER — Ambulatory Visit
Admit: 2019-04-11 | Discharge: 2019-05-01 | Payer: MEDICARE | Attending: Radiation Oncology | Primary: Radiation Oncology

## 2019-04-11 ENCOUNTER — Ambulatory Visit: Admit: 2019-04-11 | Discharge: 2019-04-12 | Payer: MEDICARE | Attending: Internal Medicine | Primary: Internal Medicine

## 2019-04-11 DIAGNOSIS — C7951 Secondary malignant neoplasm of bone: Principal | ICD-10-CM

## 2019-04-11 DIAGNOSIS — C61 Malignant neoplasm of prostate: Principal | ICD-10-CM

## 2019-04-11 LAB — COMPREHENSIVE METABOLIC PANEL
ALBUMIN: 2.8 g/dL — ABNORMAL LOW (ref 3.5–5.0)
ALKALINE PHOSPHATASE: 289 U/L — ABNORMAL HIGH (ref 38–126)
ALT (SGPT): 15 U/L (ref <50)
ANION GAP: 4 mmol/L — ABNORMAL LOW (ref 7–15)
AST (SGOT): 110 U/L — ABNORMAL HIGH (ref 19–55)
BILIRUBIN TOTAL: 0.4 mg/dL (ref 0.0–1.2)
BLOOD UREA NITROGEN: 14 mg/dL (ref 7–21)
BUN / CREAT RATIO: 24
CALCIUM: 8.3 mg/dL — ABNORMAL LOW (ref 8.5–10.2)
CHLORIDE: 95 mmol/L — ABNORMAL LOW (ref 98–107)
CO2: 29 mmol/L (ref 22.0–30.0)
CREATININE: 0.58 mg/dL — ABNORMAL LOW (ref 0.70–1.30)
EGFR CKD-EPI NON-AA MALE: >90 mL/min/{1.73_m2} (ref >=60)
GLUCOSE RANDOM: 114 mg/dL (ref 70–179)
POTASSIUM: 4.7 mmol/L (ref 3.5–5.0)
PROTEIN TOTAL: 5.3 g/dL — ABNORMAL LOW (ref 6.5–8.3)
SODIUM: 128 mmol/L — ABNORMAL LOW (ref 135–145)

## 2019-04-11 LAB — CBC W/ AUTO DIFF
BASOPHILS ABSOLUTE COUNT: 0 10*9/L (ref 0.0–0.1)
BASOPHILS RELATIVE PERCENT: 0.3 %
EOSINOPHILS ABSOLUTE COUNT: 0.1 10*9/L (ref 0.0–0.4)
EOSINOPHILS RELATIVE PERCENT: 1.7 %
HEMATOCRIT: 23.2 % — ABNORMAL LOW (ref 41.0–53.0)
HEMOGLOBIN: 7.5 g/dL — ABNORMAL LOW (ref 13.5–17.5)
LARGE UNSTAINED CELLS: 1 % (ref 0–4)
LYMPHOCYTES ABSOLUTE COUNT: 0.6 10*9/L — ABNORMAL LOW (ref 1.5–5.0)
LYMPHOCYTES RELATIVE PERCENT: 10 %
MEAN CORPUSCULAR HEMOGLOBIN CONC: 32.4 g/dL (ref 31.0–37.0)
MEAN CORPUSCULAR HEMOGLOBIN: 31.7 pg (ref 26.0–34.0)
MEAN CORPUSCULAR VOLUME: 97.8 fL (ref 80.0–100.0)
MEAN PLATELET VOLUME: 9.1 fL (ref 7.0–10.0)
MONOCYTES RELATIVE PERCENT: 5.2 %
NEUTROPHILS ABSOLUTE COUNT: 5.1 10*9/L (ref 2.0–7.5)
NEUTROPHILS RELATIVE PERCENT: 81.5 %
PLATELET COUNT: 280 10*9/L (ref 150–440)
RED BLOOD CELL COUNT: 2.38 10*12/L — ABNORMAL LOW (ref 4.50–5.90)
RED CELL DISTRIBUTION WIDTH: 19.2 % — ABNORMAL HIGH (ref 12.0–15.0)
WBC ADJUSTED: 6.2 10*9/L (ref 4.5–11.0)

## 2019-04-11 LAB — PROSTATE SPECIFIC ANTIGEN: Prostate specific Ag:MCnc:Pt:Ser/Plas:Qn:: 311 — ABNORMAL HIGH

## 2019-04-11 LAB — ALBUMIN: Albumin:MCnc:Pt:Ser/Plas:Qn:: 2.8 — ABNORMAL LOW

## 2019-04-11 LAB — SMEAR REVIEW

## 2019-04-11 LAB — HEMATOCRIT: Hematocrit:VFr:Pt:Bld:Qn:: 23.2 — ABNORMAL LOW

## 2019-04-11 LAB — PHOSPHORUS: Phosphate:MCnc:Pt:Ser/Plas:Qn:: 3.1

## 2019-04-11 NOTE — Unmapped (Signed)
Genitourinary Oncology Follow-up Visit Note    Patient Name: Seth Mcguire  Patient Age: 79 y.o.  Encounter Date: 04/11/2019  Provider: Anson Fret     Primary Care Provider:   Lollie Marrow, MD           Patient Active Problem List   Diagnosis   ??? Levoscoliosis   ??? Routine health maintenance   ??? Gastroesophageal reflux disease without esophagitis   ??? 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 Closed wedge compression fracture of twelfth thoracic vertebra (CMS-HCC)   ??? Pathologic compression fracture of spine (CMS-HCC)   ??? 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 Encounter for peripheral line removal     Assessment/Plan:    1 yoM with metastatic prostate cancer to LN and bone.    1. Metastatic castration resistant prostate cancer: Most recently on ADT (enzalutamide), previously docetaxel   and s/p abi/pred then Radium-223 (x 3 cycles) with disease progression.  He continues to have worsening fatigue and left sided rib/back pain. No actionable mutations on ctDNA or germline sequencing.     We originally discussed transitioning to enzalutamide but understood that the chances of this controlling his cancer being rather low. It is less likely his fatigue is resulting from Enzalutamide as he has only been taking for one week. His continued worsening fatigue and overall poor quality of life is suggestive of continued disease progression and further role of Hospice which he is more amenable to at this time and has began to further investigate Hospice services.       2. Normocytic Anemia: He is without any overt signs of bleeding but does have continued fatigue and worsening SOB. Likely resulting from prior cytotoxic therapy and disease progression We discussed the role of being transfused today but he is more hopeful for tomorrow at [facility].      3. Pain: Doing ok on tramadol. Cannot tolerate oxycodone. We have discussed hospice services, and he is currently pending enrollment. His left rib pain continues and he was evaluated by rad onc to consider RT to this spot. He was originally planning for further RT to start 2/15 but will now pursue hospice as this delay will limit his resources from Hospice.    4. Hypervolemic Hyponatremia: asymptomatic, exam suggestive of hypervolemia, no DOE however, likely resulting from continued disease progression. Discussed use of compression stockings.    Plan:  -plan for transfusion of 1 unit pRBC   -discontinue plan for RT to left rib   -hospice enrollment (hopeful for [facility] and [facility])  -discontinue Enzalutamide  - Will discontinue ADT with  Eligard 45 mg SQ given on 12/07/2018    Patient was evaluated and discussed with attending, Dr. Salem Caster.    Thayer Headings, MD MS  Hematology/Oncology Fellow, PGY5        Reason for visit  Follow up of prostate cancer    History of Present Illness:    79 y.o. male who is seen in follow-up for metastatic prostate cancer.    The patient initially had several GI-related complaints that eventually led to his 05/23/16 CT of the abdomen and pelvis revealing irregular enhancing focal thickening of the posterior bladder wall and prostate and bulky retroperitoneal, bilateral iliac, and bilateral pelvic sidewall lymphadenopathy. He had a CT-guided biopsy of a retroperitoneal lymph node 05/30/16 as part of the evaluation of suspected bladder cancer. The lymph node was instead positive for prostatic cancer. CT of his  chest revealed only a small paratracheal lymph node. Bone scan revealed widespread metastatic disease to the bone. He was initiated on ADT and abiraterone and prednisone 06/27/16. He had PSA and clinical progression in spring 2020 and underwent XRT to the sacrum and rib. He then initiated Radium-223 in 10/12/2018. He completed 3 cycles and then had worsening pain, fatigue, and performance status with rising PSA and new lesions on bone scan, and was changed to docetaxel.    Interval History:      He started enzalutamide ~1 week ago stating since that time he has felt progressively worse. He feels that his quality of life has greatly declined since his last visit. He also is with reduced appetite. He has some nausea but no vomiting. He remains with continued constipation. He is also having insomnia reporting he is waking up and unable to fall asleep. Last night he did not sleep at all. He is napping more frequently. He also has new onset BLE swelling which is limiting his mobility. He has had 2 falls since his last visit. He did not suffer any injuries. He reports back pain which is relieved with ibuprofen.     He does admit he is hopeful to pursue hospice enrollment. He specifically has requested: [facility] and [facility].    Past Medical History:   Diagnosis Date   ??? Constipation    ??? COPD (chronic obstructive pulmonary disease) (CMS-HCC) 04/29/2016   ??? Excessive flatus    ??? Gastroesophageal reflux disease without esophagitis 04/29/2016   ??? H. pylori infection 04/2016   ??? Insomnia    ??? 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 Scoliosis    ??? Weight loss, unintentional     >10 lb over 3 months      Past Surgical History:   Procedure Laterality Date   ??? BAND HEMORRHOIDECTOMY  1979   ??? PR COLONOSCOPY FLX DX W/COLLJ SPEC WHEN PFRMD N/A 05/14/2016    Procedure: COLONOSCOPY, FLEXIBLE, PROXIMAL TO SPLENIC FLEXURE; DIAGNOSTIC, W/WO COLLECTION SPECIMEN BY BRUSH OR WASH;  Surgeon: Maris Berger, MD;  Location: [facility];  Service: Gastroenterology   ??? PR UPPER GI ENDOSCOPY,BIOPSY N/A 05/14/2016    Procedure: UGI ENDOSCOPY; WITH BIOPSY, SINGLE OR MULTIPLE;  Surgeon: Maris Berger, MD;  Location: [facility];  Service: Gastroenterology   ??? TONSILLECTOMY  1975        Family History   Problem Relation Age of Onset   ??? Alzheimer's disease Mother    ??? Hypertension Mother    ??? Stroke Father    ??? Diabetes Father    ??? Cirrhosis Paternal Grandfather Family Status   Relation Name Status   ??? Mother  (Not Specified)   ??? Father  (Not Specified)   ??? PGF  (Not Specified)     Social History     Occupational History   ??? Not on file   Tobacco Use   ??? Smoking status: Former Smoker     Packs/day: 3.00     Years: 15.00     Pack years: 45.00     Types: Cigarettes     Quit date: 03/04/1975     Years since quitting: 44.1   ??? Smokeless tobacco: Never Used   Substance and Sexual Activity   ??? Alcohol use: Yes     Alcohol/week: 0.0 standard drinks   ??? Drug use: No   ??? Sexual activity: Not on file  Lives with wife, who drives him frequently. She feeds him. Also has an adopted son, who is currently in [place] (coming back 1/9) and daughter in law works at [employer] (and her mother is a former ED Engineer, civil (consulting)).    Allergies   Allergen Reactions   ??? Latex Itching and Rash       Current Outpatient Medications:   ???  ascorbic acid (VITAMIN C ORAL), Take 2,000 Units by mouth daily., Disp: , Rfl:   ???  ascorbic acid, vitamin C, (VITAMIN C) 100 MG tablet, Take 100 mg by mouth daily., Disp: , Rfl:   ???  calcium carbonate-vit D3-min 600 mg calcium- 400 unit Tab, Take 1 tablet by mouth daily. , Disp: , Rfl:   ???  cholecalciferol, vitamin D3, (VITAMIN D3) 5,000 unit tablet, Take 5,000 Units by mouth daily. , Disp: , Rfl:   ???  dexAMETHasone (DECADRON) 4 MG tablet, Take 2 tablets (8 mg total) by mouth every twenty-one (21) days. Take 12 hours and 3 hours prior to chemotherapy., Disp: 4 tablet, Rfl: 6  ???  enzalutamide (XTANDI) 40 mg capsule, Take 3 capsules (120 mg total) by mouth daily., Disp: 120 capsule, Rfl: 6  ???  EYE VITAMIN AND MINERALS 7,160-113-100 unit-mg-unit Tab, Take 1 capsule by mouth daily., Disp: , Rfl:   ???  ibuprofen (MOTRIN) 800 MG tablet, Take 1 tablet by mouth twice daily as needed for pain, Disp: 60 tablet, Rfl: 11  ???  omeprazole (PRILOSEC) 20 MG capsule, TAKE 1 CAPSULE (20 MG TOTAL) BY MOUTH DAILY., Disp: 90 capsule, Rfl: 3  ???  predniSONE (DELTASONE) 5 MG tablet, Take 1 tablet (5 mg total) by mouth daily., Disp: 30 tablet, Rfl: 6  ???  prochlorperazine (COMPAZINE) 10 MG tablet, Take 1 tablet (10 mg total) by mouth every six (6) hours as needed (As needed for nausea)., Disp: 30 tablet, Rfl: prn  ???  tamsulosin (FLOMAX) 0.4 mg capsule, TAKE 1 CAPSULE TWICE DAILY, Disp: 180 capsule, Rfl: 3  ???  traMADoL (ULTRAM) 50 mg tablet, TAKE 1 TABLET BY MOUTH EVERY 6 HOURS AS NEEDED, Disp: 120 tablet, Rfl: 0    Current Facility-Administered Medications:   ???  denosumab (PROLIA) injection 60 mg, 60 mg, Subcutaneous, Q6 Months, [employer], MD, 60 mg at 02/05/18 1637    Review of Systems:  A comprehensive review of 10 systems was negative except for pertinent positives noted in HPI.    Physical Examination:    Vitals:    04/11/19 1332   BP: 113/58   Pulse: 97   Resp: 18   Temp: 37.1 ??C (98.8 ??F)   SpO2: 100%     ECOG Performance Status:2  GENERAL: Well-developed, well-nourished patient in no acute distress but appears fatigued  PERRL: sclera anicteric  CV: BLE 2+ pitting edema  PULM: breathing comfortably on RA, no use of accessory muscles  MSK: no reproducible tenderness of BLE  SKIN: no rashes or lesions  NEURO: no focal deficits appreciated, following commands appropriately        Labs:       Lab on 04/11/2019   Component Date Value Ref Range Status   ??? Phosphorus 04/11/2019 3.1  2.9 - 4.7 mg/dL Final   ??? Sodium 16/12/9602 128* 135 - 145 mmol/L Final   ??? Potassium 04/11/2019 4.7  3.5 - 5.0 mmol/L Final   ??? Chloride 04/11/2019 95* 98 - 107 mmol/L Final   ??? Anion Gap 04/11/2019 4* 7 - 15 mmol/L Final   ???  CO2 04/11/2019 29.0  22.0 - 30.0 mmol/L Final   ??? BUN 04/11/2019 14  7 - 21 mg/dL Final   ??? Creatinine 04/11/2019 0.58* 0.70 - 1.30 mg/dL Final   ??? BUN/Creatinine Ratio 04/11/2019 24   Final   ??? EGFR CKD-EPI Non-African American,* 04/11/2019 >90  >=60 mL/min/1.68m2 Final   ??? EGFR CKD-EPI African American, Male 04/11/2019 >90  >=60 mL/min/1.53m2 Final   ??? Glucose 04/11/2019 114  70 - 179 mg/dL Final   ??? Calcium 16/12/9602 8.3* 8.5 - 10.2 mg/dL Final   ??? Albumin 54/11/8117 2.8* 3.5 - 5.0 g/dL Final   ??? Total Protein 04/11/2019 5.3* 6.5 - 8.3 g/dL Final   ??? Total Bilirubin 04/11/2019 0.4  0.0 - 1.2 mg/dL Final   ??? AST 14/78/2956 110* 19 - 55 U/L Final   ??? ALT 04/11/2019 15  <50 U/L Final   ??? Alkaline Phosphatase 04/11/2019 289* 38 - 126 U/L Final   ??? WBC 04/11/2019 6.2  4.5 - 11.0 10*9/L Final   ??? RBC 04/11/2019 2.38* 4.50 - 5.90 10*12/L Final   ??? HGB 04/11/2019 7.5* 13.5 - 17.5 g/dL Final   ??? HCT 21/30/8657 23.2* 41.0 - 53.0 % Final   ??? MCV 04/11/2019 97.8  80.0 - 100.0 fL Final   ??? MCH 04/11/2019 31.7  26.0 - 34.0 pg Final   ??? MCHC 04/11/2019 32.4  31.0 - 37.0 g/dL Final   ??? RDW 84/69/6295 19.2* 12.0 - 15.0 % Final   ??? MPV 04/11/2019 9.1  7.0 - 10.0 fL Final   ??? Platelet 04/11/2019 280  150 - 440 10*9/L Final   ??? Variable HGB Concentration 04/11/2019 Slight* Not Present Final   ??? Neutrophils % 04/11/2019 81.5  % Final   ??? Lymphocytes % 04/11/2019 10.0  % Final   ??? Monocytes % 04/11/2019 5.2  % Final   ??? Eosinophils % 04/11/2019 1.7  % Final   ??? Basophils % 04/11/2019 0.3  % Final   ??? Absolute Neutrophils 04/11/2019 5.1  2.0 - 7.5 10*9/L Final   ??? Absolute Lymphocytes 04/11/2019 0.6* 1.5 - 5.0 10*9/L Final   ??? Absolute Monocytes 04/11/2019 0.3  0.2 - 0.8 10*9/L Final   ??? Absolute Eosinophils 04/11/2019 0.1  0.0 - 0.4 10*9/L Final   ??? Absolute Basophils 04/11/2019 0.0  0.0 - 0.1 10*9/L Final   ??? Large Unstained Cells 04/11/2019 1  0 - 4 % Final   ??? Macrocytosis 04/11/2019 Moderate* Not Present Final   ??? Anisocytosis 04/11/2019 Moderate* Not Present Final   ??? Hypochromasia 04/11/2019 Marked* Not Present Final       Lab Results   Component Value Date/Time    PSA 412.00 (H) 02/28/2019 01:16 PM    PSA 203.00 (H) 02/04/2019 02:41 PM    PSA 177.00 (H) 01/14/2019 01:42 PM    PSA 143.00 (H) 01/10/2019 10:58 AM    PSA 66.40 (H) 12/07/2018 11:23 AM    PSA 23.90 (H) 11/09/2018 11:17 AM    PSA 13.10 (H) 10/12/2018 11:27 AM    PSA 9.29 (H) 07/27/2018 10:30 AM    PSA 5.43 (H) 06/04/2018 03:55 PM    PSA 3.19 04/26/2018 02:14 PM    PSA 3.76 04/01/2018 02:46 PM    PSA 3.31 03/29/2018 01:08 PM    PSA 2.86 02/01/2018 12:42 PM    PSA 1.86 12/21/2017 10:33 AM    PSA 0.77 09/14/2017 12:33 PM    PSA 0.42 06/11/2017 11:39 AM    PSA 0.17 03/09/2017 12:56  PM    PSA 0.21 12/04/2016 11:09 AM    PSA 0.17 10/22/2016 10:53 AM    PSA 0.22 09/04/2016 11:37 AM    PSA 13.20 (H) 06/05/2016 01:34 PM       Pathology     05/14/16 A. Lymph node, retroperitoneal, core biopsy and touch preparation:  - Diagnostic of malignancy.  - Poorly differentiated non-small cell carcinoma, consistent with prostate origin (see comment).  - No background lymph node identified  - See comment  The malignant cells are discohesive in the touch prep and solid appearing in the core biopsy material.  The core biopsy material is scant, but the malignant cells are very pleomorphic with very large and somewhat irregularly shaped nucleoli.  There is moderate amphophilic cytoplasm with some cytoplasmic vacuoles.  No background lymphocytes are identified for documentation of sampling of a lymph node. This may represent a completely replaced lymph node versus a discrete tumor mass. Clinical correlation is required.    Imaging    CT CAP 03/09/17:  Multiple sclerotic osseous metastases throughout the bilateral ribs, spine, and pelvis, similar in distribution compared to prior bone scan on 06/24/2016. Increased sclerosis noted compared with 05/23/2016. Consider further evaluation with repeat bone scan.  New severe compression deformity of the T12 vertebral body, likely pathologic.  Markedly distended bladder, correlate for history of urinary retention/bladder outlet obstruction.  Layering bladder calculi.  Interval resolution of retroperitoneal, pelvic, and left upper paratracheal lymphadenopathy.    NM Bone Scan Whole Body 06/24/16  Widespread metabolically active osseous metastatic disease, as above.    DEXA 03/26/17:  The bone mineral density in the spine measuring L1 to 4 measures 0.992 gm/cm2. ??The ??Z score is 0.2 and the T score is -0.9.  The total bone mineral density in the proximal left femur measures 0.767 gm/cm2. ??The Z score is -0.8 and the T score is -1.8. ??The femoral neck density is 0.566 gm/cm2, and the T score is -2.7. ??The other T scores range from -1.9 to -1.5.   Impression  Lumbar spine: Normal bone density  Left proximal femur: Low bone density, particularly in the femoral neck.    CT CAP 09/07/17  Pathologic fracture involving the right anterior seventh rib, new from prior CT, although likely subacute.  Moderate compression fracture deformity at T12, unchanged.  Multifocal osseous metastatic lesions, stable compared to prior exam on March 09, 2017. ??No new sites of disease identified.    Bone scan 09/07/17:  -- Sequela of the right seventh rib subacute fracture deformity, better evaluated on same day CT.  -- Increased bilateral anterior tibial shaft uptake is nonspecific and may reflect tendinopathy.  -- Diffuse osseous metastatic disease, unchanged to slightly decreased overall.    CT CAP 12/21/17  No pulmonary metastasis.  Stable osseous metastasis. Right fourth and fifth rib uptake seen on isotope bone scan is not appreciated on CT.  Multifocal osseous metastatic lesions in the visualized thoracolumbar spine and pelvis, grossly stable compared to 09/07/2017. No new sites of disease identified.    Bone scan 12/21/17:  - New increased foci of uptake within the proximal right fifth rib and possibly fourth rib. Recommend CT scan.  - Otherwise, numerous foci worrisome for metastases, as above; recommend correlation with CT scan.    Bone scan 03/19/17:  Since 12/21/2017:  - Overall slightly increased increased to stable diffuse osseous metastatic lesions.  - No new metastatic lesions.    CT CAP 03/29/18:  --No pulmonary metastases.  --New right 7th posterior  pathologic rib fracture.    Bone scan 07/27/2018 IMPRESSION:  Compared to bone scan from 03/19/2018:  -Overall slightly increased osseous metastatic disease.  -No new metastatic lesions.    CT CAP 07/27/2018  IMPRESSION:  -Numerous metastatic osseous lesions throughout the spine and pelvis, some of which demonstrate decreased sclerosis compared to prior. The S1 osseous metastasis has worsened with new cortical disruption and anterior extension.   - Please see report from concurrently dictated CT chest for description of findings above the level of the diaphragm.  IMPRESSION:  No pulmonary metastases.    CT CAP 01/07/19:  --New enhancing masses of the bilateral adrenal glands suspicious for metastatic disease.  --Redemonstrated numerous metastatic osseous lesions of the spine. Most notably there is interval anterior extension and worsening osseous destruction of the S1 metastatic lesion. There is also a new suspicious sclerotic foci of the L4 vertebral body.   --Mild hydronephrosis and bilateral ureteral dilation without evidence of intrinsic or extrinsic obstruction.    Bone scan 01/07/19:  Numerous new sites of osseous metastatic disease as noted above.    CT CAP 03/22/19:  Increasing extraosseous soft tissue component of the right posterior fifth rib metastatic lesion. New small bilateral pleural effusions. No other significant change within the thorax compared to 01/07/2019.  Slightly increased right and similar appearing left adrenal metastases.  Similar osseous metastatic disease throughout the skeleton, as above. Some lesions are more sclerotic on today's exam, possibly post treatment change.    Bone scan 03/22/19:  Multiple new sites of osseous metastatic disease as noted above.

## 2019-04-11 NOTE — Unmapped (Addendum)
It was a pleasure seeing you today. We discussed the role of Hospice today and the support that they can give you. We also recommended that you stop taking Enzalutamide as it is likely worsening your current quality of life. You are also with signs of anemia today and we recommended a blood transfusion which you are hopeful to get by Thursday.    Labwork:  Lab on 04/11/2019   Component Date Value Ref Range Status   ??? Phosphorus 04/11/2019 3.1  2.9 - 4.7 mg/dL Final   ??? Sodium 09/81/1914 128* 135 - 145 mmol/L Final   ??? Potassium 04/11/2019 4.7  3.5 - 5.0 mmol/L Final   ??? Chloride 04/11/2019 95* 98 - 107 mmol/L Final   ??? Anion Gap 04/11/2019 4* 7 - 15 mmol/L Final   ??? CO2 04/11/2019 29.0  22.0 - 30.0 mmol/L Final   ??? BUN 04/11/2019 14  7 - 21 mg/dL Final   ??? Creatinine 04/11/2019 0.58* 0.70 - 1.30 mg/dL Final   ??? BUN/Creatinine Ratio 04/11/2019 24   Final   ??? EGFR CKD-EPI Non-African American,* 04/11/2019 >90  >=60 mL/min/1.53m2 Final   ??? EGFR CKD-EPI African American, Male 04/11/2019 >90  >=60 mL/min/1.77m2 Final   ??? Glucose 04/11/2019 114  70 - 179 mg/dL Final   ??? Calcium 78/29/5621 8.3* 8.5 - 10.2 mg/dL Final   ??? Albumin 30/86/5784 2.8* 3.5 - 5.0 g/dL Final   ??? Total Protein 04/11/2019 5.3* 6.5 - 8.3 g/dL Final   ??? Total Bilirubin 04/11/2019 0.4  0.0 - 1.2 mg/dL Final   ??? AST 69/62/9528 110* 19 - 55 U/L Final   ??? ALT 04/11/2019 15  <50 U/L Final   ??? Alkaline Phosphatase 04/11/2019 289* 38 - 126 U/L Final   ??? WBC 04/11/2019 6.2  4.5 - 11.0 10*9/L Final   ??? RBC 04/11/2019 2.38* 4.50 - 5.90 10*12/L Final   ??? HGB 04/11/2019 7.5* 13.5 - 17.5 g/dL Final   ??? HCT 41/32/4401 23.2* 41.0 - 53.0 % Final   ??? MCV 04/11/2019 97.8  80.0 - 100.0 fL Final   ??? MCH 04/11/2019 31.7  26.0 - 34.0 pg Final   ??? MCHC 04/11/2019 32.4  31.0 - 37.0 g/dL Final   ??? RDW 02/72/5366 19.2* 12.0 - 15.0 % Final   ??? MPV 04/11/2019 9.1  7.0 - 10.0 fL Final   ??? Platelet 04/11/2019 280  150 - 440 10*9/L Final   ??? Variable HGB Concentration 04/11/2019 Slight* Not Present Final   ??? Neutrophils % 04/11/2019 81.5  % Final   ??? Lymphocytes % 04/11/2019 10.0  % Final   ??? Monocytes % 04/11/2019 5.2  % Final   ??? Eosinophils % 04/11/2019 1.7  % Final   ??? Basophils % 04/11/2019 0.3  % Final   ??? Absolute Neutrophils 04/11/2019 5.1  2.0 - 7.5 10*9/L Final   ??? Absolute Lymphocytes 04/11/2019 0.6* 1.5 - 5.0 10*9/L Final   ??? Absolute Monocytes 04/11/2019 0.3  0.2 - 0.8 10*9/L Final   ??? Absolute Eosinophils 04/11/2019 0.1  0.0 - 0.4 10*9/L Final   ??? Absolute Basophils 04/11/2019 0.0  0.0 - 0.1 10*9/L Final   ??? Large Unstained Cells 04/11/2019 1  0 - 4 % Final   ??? Macrocytosis 04/11/2019 Moderate* Not Present Final   ??? Anisocytosis 04/11/2019 Moderate* Not Present Final   ??? Hypochromasia 04/11/2019 Marked* Not Present Final       Please call us if you experience:   1. Nausea or vomiting  not controlled by nausea medicines  2. Fever of 100.4 F or higher   3. Uncontrolled pain  4. Any other concerning symptom     For any cancer-related concerns, including:   Appointment information   Questions regarding your cancer diagnosis or treatment   Any new symptoms.     Please call [phone].    For EMERGENCIES ONLY, on Nights, Weekends and Holidays please call and ask for the oncologist on call.    [facility]  [address]  [place], [place] [address]  [email or web address]

## 2019-04-11 NOTE — Unmapped (Signed)
RADIATION ONCOLOGY TREATMENT PLANNING NOTE     Patient Name: Seth Mcguire  Patient Age: 79 y.o.  Date of Encounter: 04/11/2019    CLINICAL TREATMENT PLANNING:     Dejaun Vidrio Serpas has metastatic prostate cancer with a painful lesion ~T11/T12. Refer to the consult for full clinical details.    I plan to treat him/her with photons utilizing 3D CRT technique.     The radiation target area/treatment site will be T10-L1 vertebral bodies as well as the L costovertebral junction on the as pain is somewhat lateralized to the L.    I will attempt to minimize the dose to lung, heart, kidneys, prior RT fields    The total radiation dose will be 2000 cGy at 400 cGy/fraction for a total of 5 fractions, treated once a day. Chemotherapy not administered.    Treatment Intent: palliative.    Technique Rationale:     3DCRT: 3D conformal therapy is necessary to increase dose conformity and to review DVH's and to review isodose distribution for the target and normal tissue structures listed above    Simulation Order: I requested radiation treatment planning CT scan to acquire information regarding patient's anatomy of the treatment site, radiation treatment target volumes, and adjacent normal organs. This is required to be done in patient's treatment position for radiation treatment planning and for patient's subsequent radiation treatment positioning and treatment setups.     Image Fusion:  Prior RT plan    Special Treatment Procedure: Treatment planning for Star Cheese Revere is more complex and requires more time because patient has had prior radiation necessitating special consideration of potential overlap.    Verification Simulation: I have ordered for the patient to come in for verification simulation on the treatment machine to ensure that information was transferred appropriately from the planning system to the treatment machine treatment and to verify patient setup, immobilization, and image guidance.    Image Guidance/Tracking: Weekly PORT films to verify agreement of treatment ports with original planned fields.     SIMULATION:    Type:  Initial simulation of the abdomen/chest    Contrast: None    The patient was taken to the CT simulation room and placed in a supine position. Catheters/markers were placed on surface anatomy of interest including prior tattoos.  A CT images were obtained from the upper thorax to low abdomen.  I approved the patient set up and reviewed the CT images; both are adequate. I tentatively plan to utilize multiple fields. The number and use of treatment devices will be determined at the time of computerized planning.    I have placed an isocenter/localization point in three dimensions on these images.  This was marked on the patient???s skin for subsequent radiation treatment set-up.  Additional details of the CT simulation are available in the departmental [facility] electronic medical record.  CT images were then transferred to the radiation treatment-planning computer for planning and dosimetry.    Rayetta Humphrey, MD  Assistant Professor  [facility] Dept of Radiation Oncology  04/11/2019

## 2019-04-12 ENCOUNTER — Telehealth: Payer: Self-pay

## 2019-04-12 NOTE — Telephone Encounter (Signed)
Kim from [facility] stated that the pt reached out inquiring about Hospice. More specifically about help around the home and med management to cut back the strain on his wife since he has prostate cancer. Maudie Mercury thinks Palliative care would be more appropriate but would like a PCP recommendation. Please advise.     Kim CB# [phone]

## 2019-04-12 NOTE — Unmapped (Signed)
Hi,    Appointment has been scheduled for 04/15/19. Patient Seth Mcguire has been notified of appointment details and verbalized satisfaction with appointment date and time.     Thank you,  Joycelyn Man  [facility] Cancer Communication Center  [phone]

## 2019-04-13 ENCOUNTER — Institutional Professional Consult (permissible substitution)
Admit: 2019-04-13 | Payer: MEDICARE | Attending: Pharmacist Clinician (PhC)/ Clinical Pharmacy Specialist | Primary: Pharmacist Clinician (PhC)/ Clinical Pharmacy Specialist

## 2019-04-13 NOTE — Telephone Encounter (Signed)
[name] [name] has been informed.

## 2019-04-13 NOTE — Telephone Encounter (Signed)
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

## 2019-04-13 NOTE — Telephone Encounter (Signed)
Name correction Juliann Pulse not Maudie Mercury.  Called Kathy back, LVM to call back to discuss.

## 2019-04-14 NOTE — Unmapped (Signed)
Attempted to contact for visit. No answer on several attempts.     Laverna Peace PharmD, BCOP, CPP  Hematology/Oncology Pharmacist  P: [phone]

## 2019-04-15 ENCOUNTER — Ambulatory Visit: Admit: 2019-04-15 | Discharge: 2019-04-16 | Payer: MEDICARE

## 2019-04-15 DIAGNOSIS — C61 Malignant neoplasm of prostate: Principal | ICD-10-CM

## 2019-04-15 DIAGNOSIS — C7951 Secondary malignant neoplasm of bone: Principal | ICD-10-CM

## 2019-04-15 LAB — CBC W/ AUTO DIFF
BASOPHILS ABSOLUTE COUNT: 0 10*9/L (ref 0.0–0.1)
BASOPHILS RELATIVE PERCENT: 0.7 %
EOSINOPHILS ABSOLUTE COUNT: 0.1 10*9/L (ref 0.0–0.7)
HEMATOCRIT: 20.2 % — ABNORMAL LOW (ref 38.0–50.0)
HEMOGLOBIN: 6.8 g/dL — ABNORMAL LOW (ref 13.5–17.5)
LYMPHOCYTES ABSOLUTE COUNT: 0.6 10*9/L — ABNORMAL LOW (ref 0.7–4.0)
LYMPHOCYTES RELATIVE PERCENT: 9.9 %
MEAN CORPUSCULAR HEMOGLOBIN CONC: 33.7 g/dL (ref 30.0–36.0)
MEAN CORPUSCULAR HEMOGLOBIN: 31.4 pg (ref 26.0–34.0)
MEAN CORPUSCULAR VOLUME: 93 fL (ref 81.0–95.0)
MEAN PLATELET VOLUME: 8.6 fL (ref 7.0–10.0)
MONOCYTES ABSOLUTE COUNT: 0.5 10*9/L (ref 0.1–1.0)
MONOCYTES RELATIVE PERCENT: 9 %
NEUTROPHILS ABSOLUTE COUNT: 4.7 10*9/L (ref 1.7–7.7)
NEUTROPHILS RELATIVE PERCENT: 79.4 %
RED CELL DISTRIBUTION WIDTH: 20.4 % — ABNORMAL HIGH (ref 12.0–15.0)
WBC ADJUSTED: 6 10*9/L (ref 3.5–10.5)

## 2019-04-15 LAB — ANISOCYTOSIS

## 2019-04-16 NOTE — Unmapped (Signed)
VS taken per protocol. Labs drawn via newly place PIV.  PIV flushed and brisk blood return  noted.  Pt tolerated well.    Results reviewed. intervention needed.  Premeds given.   Pt received 1 unit of pRBCs - no adverse events noted.   AVS given.    Pt stable and ambulated independently from clinic using cane accompanied by wife.  C/o nausea when he sat up in bed from a lying position.  ODT Ondansetron ordered by APP on site and given by this Clinical research associate.  VWilliams,RN.

## 2019-04-16 NOTE — Unmapped (Signed)
Infusion on 04/15/2019   Component Date Value Ref Range Status   ??? Blood Type 04/15/2019 O POS   Final   ??? Antibody Screen 04/15/2019 NEG   Final   ??? WBC 04/15/2019 6.0  3.5 - 10.5 10*9/L Final   ??? RBC 04/15/2019 2.17* 4.32 - 5.72 10*12/L Final   ??? HGB 04/15/2019 6.8* 13.5 - 17.5 g/dL Final   ??? HCT 28/41/3244 20.2* 38.0 - 50.0 % Final   ??? MCV 04/15/2019 93.0  81.0 - 95.0 fL Final   ??? MCH 04/15/2019 31.4  26.0 - 34.0 pg Final   ??? MCHC 04/15/2019 33.7  30.0 - 36.0 g/dL Final   ??? RDW 03/05/7251 20.4* 12.0 - 15.0 % Final   ??? MPV 04/15/2019 8.6  7.0 - 10.0 fL Final   ??? Platelet 04/15/2019 250  150 - 450 10*9/L Final   ??? Neutrophils % 04/15/2019 79.4  % Final   ??? Lymphocytes % 04/15/2019 9.9  % Final   ??? Monocytes % 04/15/2019 9.0  % Final   ??? Eosinophils % 04/15/2019 1.0  % Final   ??? Basophils % 04/15/2019 0.7  % Final   ??? Absolute Neutrophils 04/15/2019 4.7  1.7 - 7.7 10*9/L Final   ??? Absolute Lymphocytes 04/15/2019 0.6* 0.7 - 4.0 10*9/L Final   ??? Absolute Monocytes 04/15/2019 0.5  0.1 - 1.0 10*9/L Final   ??? Absolute Eosinophils 04/15/2019 0.1  0.0 - 0.7 10*9/L Final   ??? Absolute Basophils 04/15/2019 0.0  0.0 - 0.1 10*9/L Final   ??? Anisocytosis 04/15/2019 Marked* Not Present Final   ??? Blood Type 04/15/2019 O POS   Final   ??? Crossmatch 04/15/2019 Compatible   Final   ??? Unit Blood Type 04/15/2019 O Pos   Final   ??? ISBT Number 04/15/2019 [id number]   Final   ??? Unit # 04/15/2019 [id number]   Final   ??? Status 04/15/2019 Ready   Final   ??? Spec Expiration 04/15/2019 [id number]   Final   ??? Product ID 04/15/2019 Red Blood Cells   Final   ??? PRODUCT CODE 04/15/2019 [id number]   Final   ??? Crossmatch 04/15/2019 Compatible   Final   ??? Unit Blood Type 04/15/2019 O Pos   Final   ??? ISBT Number 04/15/2019 [id number]   Final   ??? Unit # 04/15/2019 [id number]   Final   ??? Status 04/15/2019 Issued   Final   ??? Spec Expiration 04/15/2019 [id number]   Final   ??? Product ID 04/15/2019 Red Blood Cells   Final   ??? PRODUCT CODE 04/15/2019 [id number] Final

## 2019-04-18 ENCOUNTER — Ambulatory Visit
Admit: 2019-04-18 | Discharge: 2019-05-01 | Payer: MEDICARE | Attending: Radiation Oncology | Primary: Radiation Oncology

## 2019-04-18 ENCOUNTER — Ambulatory Visit: Admit: 2019-04-18 | Discharge: 2019-05-01 | Payer: MEDICARE

## 2019-04-18 ENCOUNTER — Encounter
Admit: 2019-04-18 | Discharge: 2019-05-01 | Payer: MEDICARE | Attending: Radiation Oncology | Primary: Radiation Oncology

## 2019-04-18 DIAGNOSIS — C61 Malignant neoplasm of prostate: Principal | ICD-10-CM

## 2019-04-18 DIAGNOSIS — C7951 Secondary malignant neoplasm of bone: Principal | ICD-10-CM

## 2019-04-18 NOTE — Unmapped (Signed)
Patient's spouse called and stated patient has been very fatigued since blood transfusion on Friday. Patient unable to get out of bed since last night due to the fatigue. Patient not eating well. Patient not coming today but will try to come tomorrow. Machine already aware. Dr. Dawna Part and Leane Call notified.

## 2019-04-18 NOTE — Unmapped (Signed)
Hi,     Cathy with [facility] contacted the Communication Center requesting to speak with the care team of Bj Morlock Cirilo to discuss:    I just wanted to let know that her patient reached out about hospice services. I would like to know if Dr. Okey Dupre is in agreement.    Please contact Cathy at [phone].    Check Indicates criteria has been reviewed and confirmed with the patient:    [x]  Preferred Name   [x]  DOB and/or MR#  [x]  Preferred Contact Method  [x]  Phone Number(s)   []  MyChart     Thank you,   Laverna Peace  [facility] Cancer Communication Center   [phone]

## 2019-04-18 NOTE — Unmapped (Signed)
Called Seth Mcguire to let her know Dr. Okey Dupre feels hospice is appropriate for pt at this time. She will write orders and/or be Advertising account planner.    Seth Mcguire is requesting, referral, office notes, and demographics be sent to fax [phone].

## 2019-04-20 DIAGNOSIS — C7951 Secondary malignant neoplasm of bone: Principal | ICD-10-CM

## 2019-04-20 MED ORDER — TRAMADOL 50 MG TABLET
ORAL_TABLET | 0 refills | 0 days | Status: CP
Start: 2019-04-20 — End: ?

## 2019-04-20 NOTE — Unmapped (Signed)
04/20/2019    Dose Site Summary:  Rx:T10-L1: 04/20/2019: 800/2,000 cGy  Subjective/Assessment/Recommendations:    4. Pain: managed with tramadol every 6 hours and ibuprofen 800mg  before bed if needed to help sleep.

## 2019-04-20 NOTE — Unmapped (Signed)
RADIATION TREATMENT MANAGEMENT NOTE     Encounter Date: 04/20/2019  Patient Name: Seth Mcguire [facility]  Medical Record Number: [id number]    DIAGNOSIS:  78yo with metastatic prostate cancer with a painful lesion in the mid back/L posterior rib    ASSESSMENT: T10-L1- 800cGy of planned 2000cGy  Karnofsky/Lansky Performance Status: 70, Cares for self; unable to carry on normal activity or to do active work (ECOG equivalent 1)  Chemotherapy/Systemic therapy:not administered  Clinical Trial:   no    RECOMMENDATIONS:  1. Plan for Therapy: Continue treatment as planned  2. Pain:  Unchanged- managed with current regimen  3. Skin: no issues  4. Follow-up:  For status check next week.  He is planning on cancelling treatment Thurs/Fri due to pending inclement weather    SUBJECTIVE: Started treatment this week without issues.  The pain in his back is unchanged and he has a good understanding of expectations for pain relief from RT (which would occur after treatment is finished most likely).       PHYSICAL EXAM:  Vital Signs for this encounter:  There were no vitals taken for this visit.  Last weight:    Wt Readings from Last 4 Encounters:   04/15/19 56.1 kg (123 lb 10.9 oz)   04/11/19 55.2 kg (121 lb 12.8 oz)   03/30/19 55.3 kg (121 lb 14.4 oz)   02/28/19 56.7 kg (125 lb)     General:  Alert and Orientated X 3.  No acute distress.    Skin: Not examined    I have reviewed the patient's dose delivery, dosimetry, lab tests, patient treatment set-up, port films, treatment parameters and x-rays.    Rayetta Humphrey, MD  Assistant Professor  [facility] Dept of Radiation Oncology  04/20/2019

## 2019-04-27 DIAGNOSIS — C7951 Secondary malignant neoplasm of bone: Principal | ICD-10-CM

## 2019-04-27 NOTE — Unmapped (Signed)
04/27/2019    Dose Site Summary:  Rx:T10-L1: 04/27/2019: 1,600/2,000 cGy  Subjective/Assessment/Recommendations:

## 2019-04-27 NOTE — Unmapped (Signed)
RADIATION TREATMENT MANAGEMENT NOTE     Encounter Date: 04/27/2019  Patient Name: Seth Mcguire  Medical Record Number: [id number]    DIAGNOSIS:  78yo with metastatic prostate cancer with a painful lesion in the mid back/L posterior rib    ASSESSMENT: T10-L1- 1600cGy of planned 2000cGy  Karnofsky/Lansky Performance Status: 70, Cares for self; unable to carry on normal activity or to do active work (ECOG equivalent 1)  Chemotherapy/Systemic therapy:not administered  Clinical Trial:   no    RECOMMENDATIONS:  1. Plan for Therapy: Continue treatment as planned  2. Pain:  Slight improvement- managed with current regimen  3. Skin: no issues  4. Follow-up:  PRN with radiation oncology- he will continue to follow with medical oncology    SUBJECTIVE:  Tolerating treatment well so far.  He has noticed slight improvement in the back pain and was able to sleep on his back last night for the first time in a while.  Very mild diarrhea- not bothersome.  He otherwise feels well    PHYSICAL EXAM:  Vital Signs for this encounter:  There were no vitals taken for this visit.  Last weight:    Wt Readings from Last 4 Encounters:   04/15/19 56.1 kg (123 lb 10.9 oz)   04/11/19 55.2 kg (121 lb 12.8 oz)   03/30/19 55.3 kg (121 lb 14.4 oz)   02/28/19 56.7 kg (125 lb)     General:  Alert and Orientated X 3.  No acute distress.    Skin: Not examined    I have reviewed the patient's dose delivery, dosimetry, lab tests, patient treatment set-up, port films, treatment parameters and x-rays.    Rayetta Humphrey, MD  Assistant Professor  [facility] Dept of Radiation Oncology  04/27/2019

## 2019-05-02 ENCOUNTER — Ambulatory Visit: Admit: 2019-05-02 | Discharge: 2019-06-01 | Payer: MEDICARE

## 2019-05-02 NOTE — Unmapped (Signed)
This patient has been disenrolled from the [facility] specialty pharmacy services due to medication discontinuation resulting from progression of disease.    Seth Mcguire  [facility] Specialty Pharmacist

## 2019-05-03 DIAGNOSIS — C7951 Secondary malignant neoplasm of bone: Principal | ICD-10-CM

## 2019-05-03 DIAGNOSIS — C61 Malignant neoplasm of prostate: Principal | ICD-10-CM

## 2019-05-04 DIAGNOSIS — C7951 Secondary malignant neoplasm of bone: Principal | ICD-10-CM

## 2019-05-04 DIAGNOSIS — C61 Malignant neoplasm of prostate: Principal | ICD-10-CM

## 2019-05-20 MED ORDER — ACETAMINOPHEN 325 MG TABLET
ORAL_TABLET | Freq: Four times a day (QID) | ORAL | 5 refills | 15 days | Status: CP
Start: 2019-05-20 — End: ?

## 2019-05-20 MED ORDER — DEXAMETHASONE 2 MG TABLET
ORAL_TABLET | Freq: Two times a day (BID) | ORAL | 11 refills | 30 days | Status: CP
Start: 2019-05-20 — End: 2020-05-19
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
## 2019-07-02 DEATH — deceased

## 2021-01-06 IMAGING — CT CT ANGIO CHEST
2 of 6 series · 19 of 36 positions shown · IV contrast (omnipaque)
Comparison: None.

CLINICAL DATA: Chest pain.  History of metastatic prostate cancer.

EXAM:
CT ANGIOGRAPHY CHEST WITH CONTRAST
TECHNIQUE: Multidetector CT imaging of the chest was performed using the
standard protocol during bolus administration of intravenous
contrast. Multiplanar CT image reconstructions and MIPs were
obtained to evaluate the vascular anatomy.
CONTRAST:  50mL OMNIPAQUE IOHEXOL 350 MG/ML SOLN

[Series 7: pe thins · axial · 0.80mm/px · z∈[+1105,+1407]mm · 18 of 480 slices shown]
[im 24/480  lung]
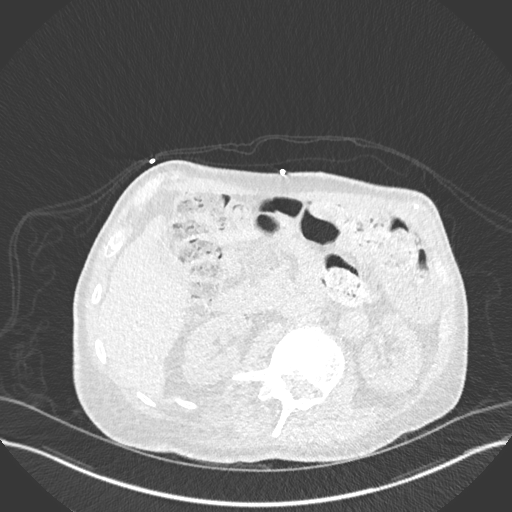
[im 48/480  mediastinal]
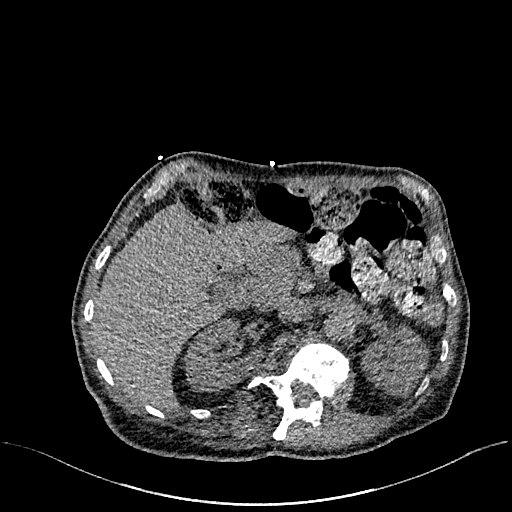
[im 72/480  lung]
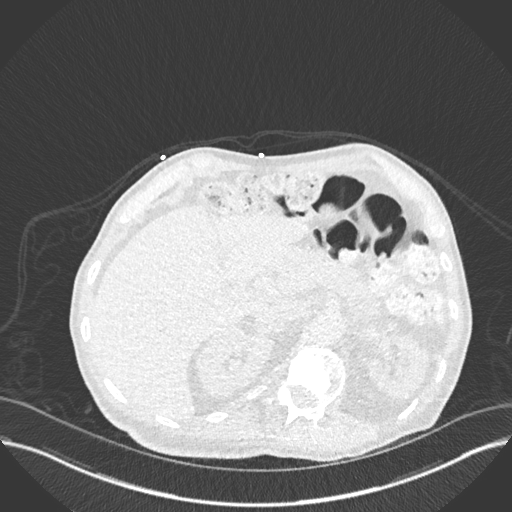
[im 96/480  mediastinal]
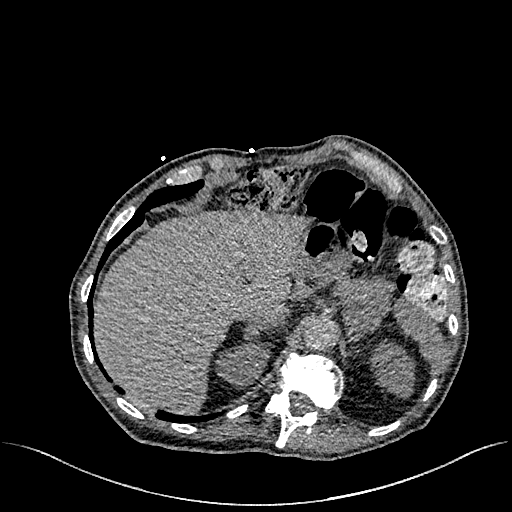
[im 120/480  lung]
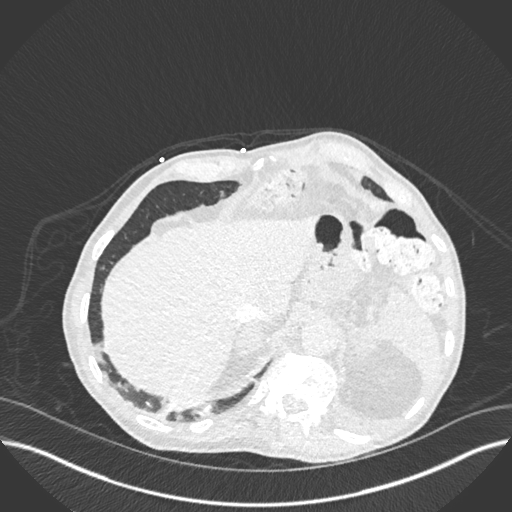
[im 144/480  mediastinal]
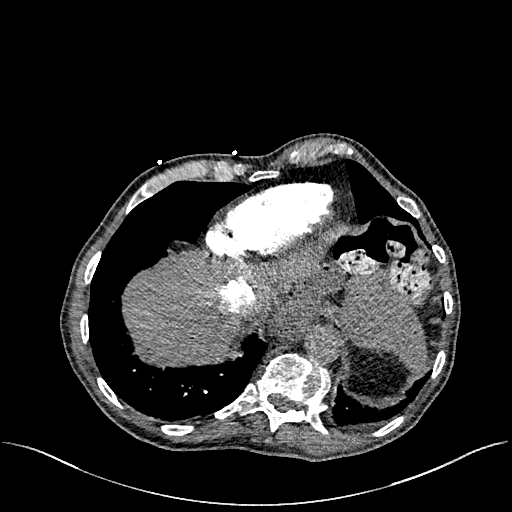
[im 168/480  lung]
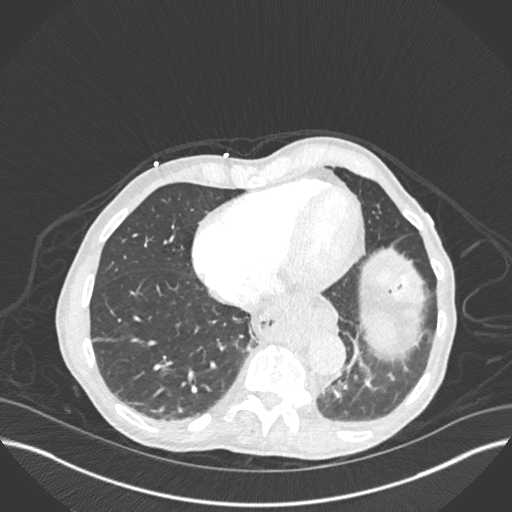
[im 192/480  mediastinal]
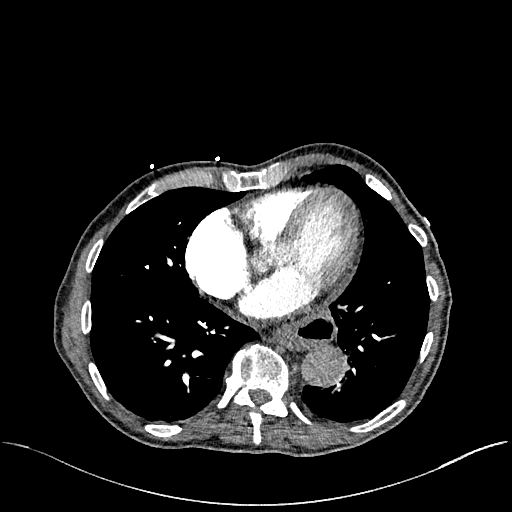
[im 216/480  lung]
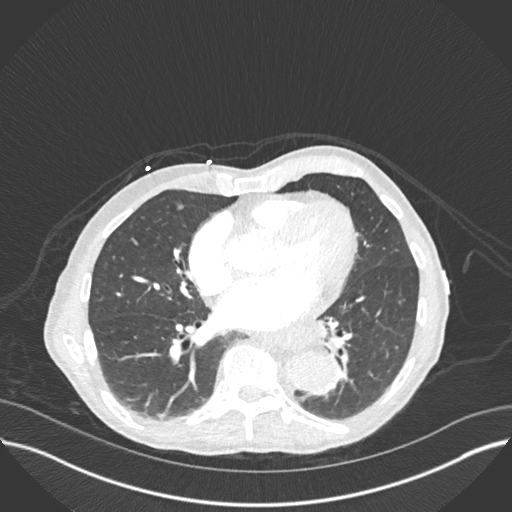
[im 264/480  mediastinal]
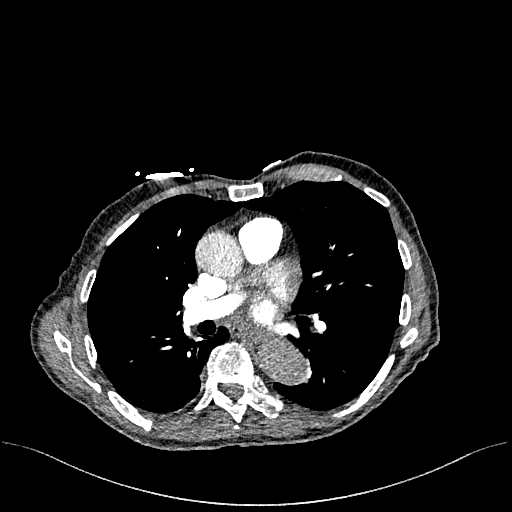
[im 288/480  lung]
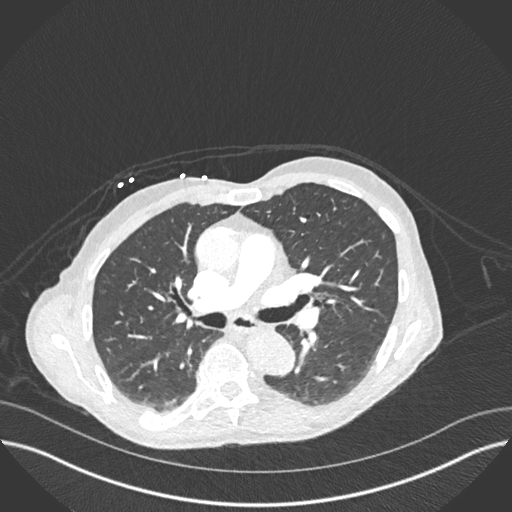
[im 312/480  mediastinal]
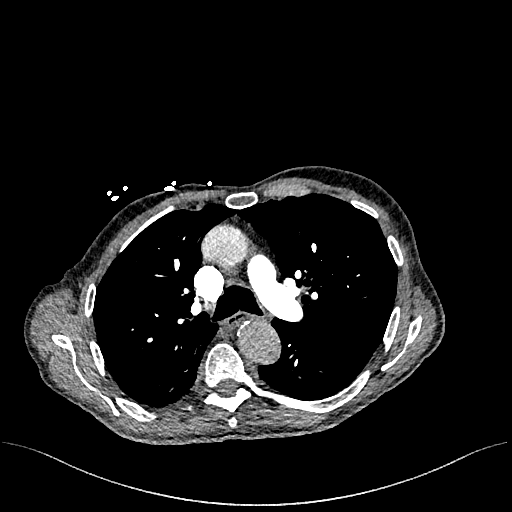
[im 336/480  lung]
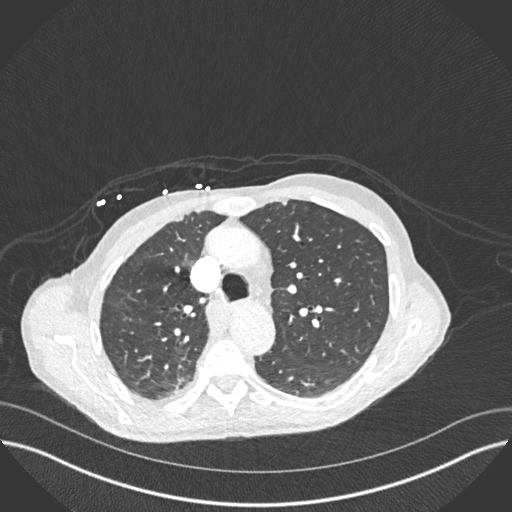
[im 360/480  mediastinal]
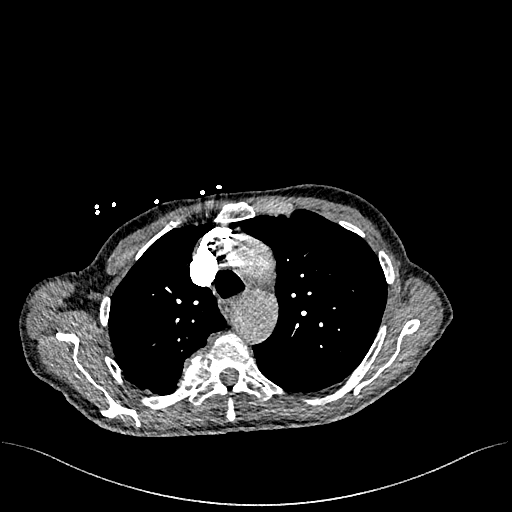
[im 384/480  lung]
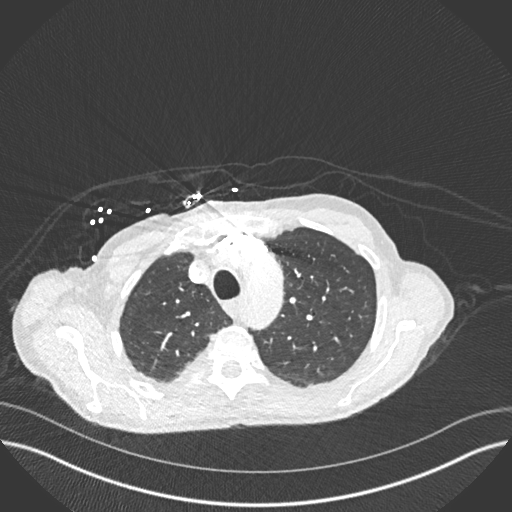
[im 408/480  mediastinal]
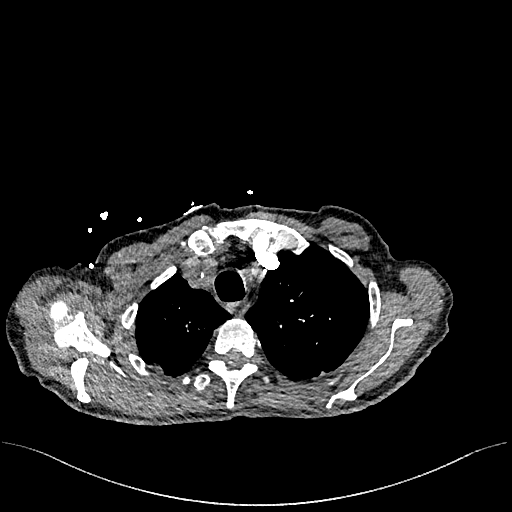
[im 432/480  lung]
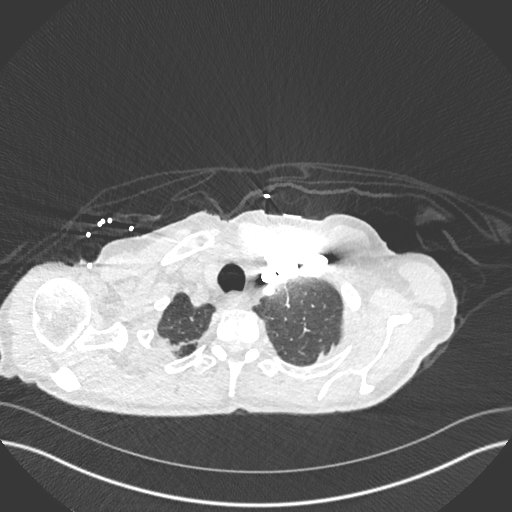
[im 456/480  mediastinal]
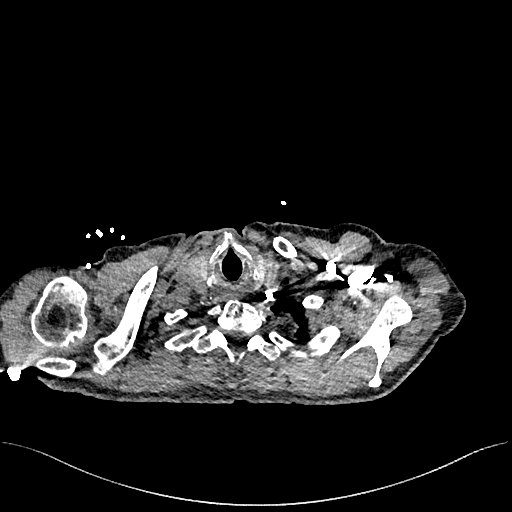

[Series 8: pe 2mm cor · coronal · 0.66mm/px · 1 of 129 slices shown]
[im 65/129  mediastinal]
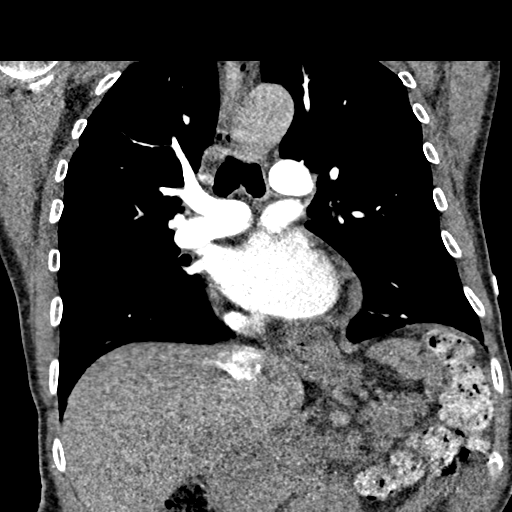

[19 of 36 positions shown; findings below may reference images not displayed]

FINDINGS: Cardiovascular: Satisfactory opacification of the pulmonary arteries
to the segmental level. No evidence of pulmonary embolism. Normal
heart size. No pericardial effusion. Mild coronary artery
calcifications are noted.

Mediastinum/Nodes: Moderate size sliding-type hiatal hernia is
noted. No adenopathy is noted. Thyroid gland is unremarkable.

Lungs/Pleura: No pneumothorax or pleural effusion is noted. Minimal
bilateral posterior basilar subsegmental atelectasis is noted.

Upper Abdomen: No acute abnormality.

Musculoskeletal: Sclerotic densities are noted in the visualized
lumbar vertebral bodies, with probable old compression fracture of
T12 which may be pathologic.

Review of the MIP images confirms the above findings.
IMPRESSION: No definite evidence of pulmonary embolus.

Mild coronary artery calcifications are noted.

Moderate size sliding-type hiatal hernia.

Sclerotic densities are noted in the visualized lumbar vertebral
bodies concerning for osseous metastases. Probable old T12
compression fracture is noted which may be pathologic in origin.
